# Patient Record
Sex: Male | Born: 1960
Health system: Southern US, Community
[De-identification: ages and names within clinical notes are randomized; demographics above are authoritative.]

## PROBLEM LIST (undated history)

## (undated) DIAGNOSIS — I10 Essential (primary) hypertension: Secondary | ICD-10-CM

## (undated) DIAGNOSIS — E663 Overweight: Secondary | ICD-10-CM

## (undated) DIAGNOSIS — E786 Lipoprotein deficiency: Secondary | ICD-10-CM

## (undated) DIAGNOSIS — G4733 Obstructive sleep apnea (adult) (pediatric): Secondary | ICD-10-CM

## (undated) DIAGNOSIS — R7401 Elevation of levels of liver transaminase levels: Secondary | ICD-10-CM

## (undated) DIAGNOSIS — R42 Dizziness and giddiness: Secondary | ICD-10-CM

## (undated) DIAGNOSIS — N200 Calculus of kidney: Secondary | ICD-10-CM

## (undated) DIAGNOSIS — E559 Vitamin D deficiency, unspecified: Secondary | ICD-10-CM

## (undated) DIAGNOSIS — E781 Pure hyperglyceridemia: Secondary | ICD-10-CM

## (undated) DIAGNOSIS — F32A Depression, unspecified: Secondary | ICD-10-CM

## (undated) DIAGNOSIS — R0683 Snoring: Secondary | ICD-10-CM

## (undated) DIAGNOSIS — F419 Anxiety disorder, unspecified: Secondary | ICD-10-CM

## (undated) DIAGNOSIS — F4002 Agoraphobia without panic disorder: Secondary | ICD-10-CM

## (undated) DIAGNOSIS — R740 Nonspecific elevation of levels of transaminase and lactic acid dehydrogenase [LDH]: Secondary | ICD-10-CM

## (undated) DIAGNOSIS — F329 Major depressive disorder, single episode, unspecified: Secondary | ICD-10-CM

## (undated) DIAGNOSIS — R7301 Impaired fasting glucose: Secondary | ICD-10-CM

## (undated) DIAGNOSIS — K760 Fatty (change of) liver, not elsewhere classified: Secondary | ICD-10-CM

## (undated) HISTORY — DX: Pure hyperglyceridemia: E78.1

## (undated) HISTORY — DX: Calculus of kidney: N20.0

## (undated) HISTORY — DX: Dizziness and giddiness: R42

## (undated) HISTORY — DX: Nonspecific elevation of levels of transaminase and lactic acid dehydrogenase (ldh): R74.0

## (undated) HISTORY — DX: Vitamin D deficiency, unspecified: E55.9

## (undated) HISTORY — DX: Essential (primary) hypertension: I10

## (undated) HISTORY — DX: Snoring: R06.83

## (undated) HISTORY — DX: Elevation of levels of liver transaminase levels: R74.01

## (undated) HISTORY — DX: Lipoprotein deficiency: E78.6

## (undated) HISTORY — DX: Fatty (change of) liver, not elsewhere classified: K76.0

## (undated) HISTORY — DX: Depression, unspecified: F32.A

## (undated) HISTORY — DX: Major depressive disorder, single episode, unspecified: F32.9

## (undated) HISTORY — DX: Anxiety disorder, unspecified: F41.9

## (undated) HISTORY — DX: Impaired fasting glucose: R73.01

## (undated) HISTORY — DX: Agoraphobia without panic disorder: F40.02

## (undated) HISTORY — DX: Obstructive sleep apnea (adult) (pediatric): G47.33

## (undated) HISTORY — DX: Overweight: E66.3

---

## 2004-03-05 ENCOUNTER — Emergency Department: Payer: Self-pay | Admitting: Internal Medicine

## 2004-03-05 ENCOUNTER — Other Ambulatory Visit: Payer: Self-pay

## 2005-02-21 ENCOUNTER — Ambulatory Visit: Payer: Self-pay | Admitting: Internal Medicine

## 2005-02-28 ENCOUNTER — Ambulatory Visit: Payer: Self-pay | Admitting: Urology

## 2005-03-16 ENCOUNTER — Other Ambulatory Visit: Payer: Self-pay

## 2005-03-17 ENCOUNTER — Ambulatory Visit: Payer: Self-pay | Admitting: Urology

## 2006-03-28 HISTORY — PX: NASAL SINUS SURGERY: SHX719

## 2006-09-30 ENCOUNTER — Ambulatory Visit: Payer: Self-pay | Admitting: Emergency Medicine

## 2006-10-07 ENCOUNTER — Ambulatory Visit: Payer: Self-pay | Admitting: Emergency Medicine

## 2006-10-07 ENCOUNTER — Other Ambulatory Visit: Payer: Self-pay

## 2006-10-09 ENCOUNTER — Ambulatory Visit: Payer: Self-pay | Admitting: Internal Medicine

## 2006-10-23 ENCOUNTER — Ambulatory Visit: Payer: Self-pay | Admitting: Otolaryngology

## 2006-11-15 ENCOUNTER — Ambulatory Visit: Payer: Self-pay | Admitting: Otolaryngology

## 2009-11-29 ENCOUNTER — Emergency Department: Payer: Self-pay | Admitting: Internal Medicine

## 2013-10-03 ENCOUNTER — Ambulatory Visit: Payer: Self-pay | Admitting: Family Medicine

## 2014-07-15 DIAGNOSIS — F4002 Agoraphobia without panic disorder: Secondary | ICD-10-CM | POA: Insufficient documentation

## 2014-07-15 DIAGNOSIS — F32A Depression, unspecified: Secondary | ICD-10-CM | POA: Insufficient documentation

## 2014-07-15 DIAGNOSIS — R740 Nonspecific elevation of levels of transaminase and lactic acid dehydrogenase [LDH]: Secondary | ICD-10-CM

## 2014-07-15 DIAGNOSIS — F419 Anxiety disorder, unspecified: Secondary | ICD-10-CM

## 2014-07-15 DIAGNOSIS — R7301 Impaired fasting glucose: Secondary | ICD-10-CM | POA: Insufficient documentation

## 2014-07-15 DIAGNOSIS — E559 Vitamin D deficiency, unspecified: Secondary | ICD-10-CM | POA: Insufficient documentation

## 2014-07-15 DIAGNOSIS — F329 Major depressive disorder, single episode, unspecified: Secondary | ICD-10-CM

## 2014-07-15 DIAGNOSIS — R7401 Elevation of levels of liver transaminase levels: Secondary | ICD-10-CM | POA: Insufficient documentation

## 2014-07-15 DIAGNOSIS — I10 Essential (primary) hypertension: Secondary | ICD-10-CM

## 2014-10-21 ENCOUNTER — Other Ambulatory Visit: Payer: Self-pay | Admitting: Family Medicine

## 2014-10-21 DIAGNOSIS — F419 Anxiety disorder, unspecified: Secondary | ICD-10-CM | POA: Insufficient documentation

## 2014-10-21 MED ORDER — LORAZEPAM 1 MG PO TABS: 1.0000 mg | ORAL_TABLET | Freq: Two times a day (BID) | ORAL | Status: DC | PRN

## 2014-10-21 NOTE — Progress Notes (Signed)
Patient will be due for refill of benzo Last note in [facility] reviewed No concern for misuse NCCS[place] reviewed; last fill of 45 pills of lorazepam was on July 21, 2014 Rx written, to be faxed to [facility] in August

## 2014-10-28 DIAGNOSIS — F419 Anxiety disorder, unspecified: Secondary | ICD-10-CM | POA: Insufficient documentation

## 2014-10-28 DIAGNOSIS — E786 Lipoprotein deficiency: Secondary | ICD-10-CM | POA: Insufficient documentation

## 2014-11-06 ENCOUNTER — Ambulatory Visit (INDEPENDENT_AMBULATORY_CARE_PROVIDER_SITE_OTHER): Payer: PRIVATE HEALTH INSURANCE | Admitting: Family Medicine

## 2014-11-06 ENCOUNTER — Other Ambulatory Visit: Payer: Self-pay | Admitting: Family Medicine

## 2014-11-06 ENCOUNTER — Encounter: Payer: Self-pay | Admitting: Family Medicine

## 2014-11-06 VITALS — BP 107/73 | HR 78 | Temp 98.0°F | Wt 239.0 lb

## 2014-11-06 DIAGNOSIS — F419 Anxiety disorder, unspecified: Secondary | ICD-10-CM | POA: Diagnosis not present

## 2014-11-06 DIAGNOSIS — J309 Allergic rhinitis, unspecified: Secondary | ICD-10-CM | POA: Insufficient documentation

## 2014-11-06 DIAGNOSIS — Z79899 Other long term (current) drug therapy: Secondary | ICD-10-CM | POA: Insufficient documentation

## 2014-11-06 DIAGNOSIS — Z5181 Encounter for therapeutic drug level monitoring: Secondary | ICD-10-CM | POA: Diagnosis not present

## 2014-11-06 DIAGNOSIS — R7401 Elevation of levels of liver transaminase levels: Secondary | ICD-10-CM

## 2014-11-06 DIAGNOSIS — Z0289 Encounter for other administrative examinations: Secondary | ICD-10-CM | POA: Diagnosis not present

## 2014-11-06 DIAGNOSIS — E663 Overweight: Secondary | ICD-10-CM

## 2014-11-06 DIAGNOSIS — R7301 Impaired fasting glucose: Secondary | ICD-10-CM | POA: Diagnosis not present

## 2014-11-06 DIAGNOSIS — R740 Nonspecific elevation of levels of transaminase and lactic acid dehydrogenase [LDH]: Secondary | ICD-10-CM

## 2014-11-06 DIAGNOSIS — E786 Lipoprotein deficiency: Secondary | ICD-10-CM

## 2014-11-06 HISTORY — DX: Overweight: E66.3

## 2014-11-06 MED ORDER — LORAZEPAM 1 MG PO TABS: 1.0000 mg | ORAL_TABLET | Freq: Two times a day (BID) | ORAL | Status: DC | PRN

## 2014-11-06 MED ORDER — ARIPIPRAZOLE 10 MG PO TABS: 10.0000 mg | ORAL_TABLET | Freq: Every day | ORAL | Status: DC

## 2014-11-06 MED ORDER — SERTRALINE HCL 100 MG PO TABS: 100.0000 mg | ORAL_TABLET | Freq: Every day | ORAL | Status: DC

## 2014-11-06 NOTE — Addendum Note (Signed)
Addended byZella Ball, Clayborne Divis J on: 11/06/2014 11:02 AM   Modules accepted: Orders

## 2014-11-06 NOTE — Assessment & Plan Note (Signed)
Check UDS. 

## 2014-11-06 NOTE — Patient Instructions (Addendum)
With your low HDL cholesterol, try to increase your physical activity (walking, basketball, swimming, etc) to 150 minutes per week Try to lose about 10 pounds before your next visit here in 3 months; that will also help raise your HDL Come fasting to your next appointment and we'll check your cholesterol then If the cough does not go away in 3-4 weeks, or gets worse, then do let me know You might consider switching for a month from allegra to claritin to see if that helps better

## 2014-11-06 NOTE — Assessment & Plan Note (Signed)
Doing well on current medicine regimen; will continue same; NCCS[place] site reviewed; no inappropriate fills of PRN benzo use

## 2014-11-06 NOTE — Assessment & Plan Note (Signed)
Last A1C was done on May 17th; recheck on or after Nov 17th at next visit

## 2014-11-06 NOTE — Assessment & Plan Note (Signed)
HDL was 37 on Aug 12, 2014; will check at next visit; encouraged exercise and modest weight loss

## 2014-11-06 NOTE — Assessment & Plan Note (Signed)
New controlled substance contract signed, discussed; UDS collected today per protocol

## 2014-11-06 NOTE — Addendum Note (Signed)
Addended byZella Ball, AMY J on: 11/06/2014 11:00 AM   Modules accepted: Orders

## 2014-11-06 NOTE — Assessment & Plan Note (Signed)
With some night-time cough; may be that the allegra is no longer efficacious; will have him consider switching to claritin instead to see if that helps; if cough does not resolve in a few weeks, I asked him to contact me

## 2014-11-06 NOTE — Assessment & Plan Note (Signed)
Last labs in May showed normal level; monitor as he is on atypical and is overweight

## 2014-11-06 NOTE — Assessment & Plan Note (Signed)
Check UDS per protocol 

## 2014-11-06 NOTE — Progress Notes (Signed)
BP 107/73 mmHg  Pulse 78  Temp(Src) 98 F (36.7 C)  Wt 239 lb (108.41 kg)  SpO2 98%   Subjective:    Patient ID: Richard [name]., male    DOB: [birth year], 54 y.o.   MRN: [id number]  HPI: Richard [name] is a 54 y.o. male  Chief Complaint  Patient presents with  . Anxiety    needs med refills   He is here for a visit for his chronic anxiety; he is on Abilify and sertraline and lorazepam; anxiety is controlled with those medicines  He has low HDL; not exercising regularly; he does play golf occasionally; takes stairs at work; would be willing to be more active  Obesity; he has tried to lose weight in the past; worked out with [employer], free weights; they have machines at work or he could go to [employer], [employer]; no sugary drinks  Allergic rhinitis; some cough at night; has been Careers adviser for about two years  Relevant past medical, surgical, family and social history reviewed and updated as indicated. Interim medical history since our last visit reviewed. Allergies and medications reviewed and updated.  Review of Systems  Constitutional: Negative for fever and chills.  HENT: Negative for ear pain.   Eyes: Negative for pain.  Respiratory: Positive for cough (cough at night some times; one week duration; air conditioner went out and got a new unit).   Gastrointestinal: Negative for diarrhea and constipation.  Endocrine: Negative for polydipsia.  Genitourinary: Negative for dysuria.  Musculoskeletal: Negative for joint swelling.  Skin: Negative for rash.  Allergic/Immunologic: Negative for food allergies.  Neurological: Negative for headaches.  Psychiatric/Behavioral: Negative for suicidal ideas, confusion, decreased concentration and agitation. The patient is nervous/anxious (chronic anxiety but controlled with medicine). The patient is not hyperactive.     Per HPI unless specifically indicated above  Outpatient Encounter Prescriptions as of 11/06/2014  Medication Sig   . ARIPiprazole (ABILIFY) 10 MG tablet Take 1 tablet (10 mg total) by mouth daily.  [name] azelastine (ASTELIN) 0.1 % nasal spray Place into both nostrils 2 (two) times daily. Use in each nostril as directed  . cholecalciferol (VITAMIN D) 1000 UNITS tablet Take 1,000 Units by mouth daily.  . fexofenadine (ALLEGRA) 180 MG tablet Take 180 mg by mouth daily.  [name] LORazepam (ATIVAN) 1 MG tablet Take 1 tablet (1 mg total) by mouth every 12 (twelve) hours as needed for anxiety. Do NOT drive; do NOT combine with pain medicine, sleeping pills, or alcohol  . Multiple Vitamin (MULTIVITAMIN) tablet Take 1 tablet by mouth daily.  . sertraline (ZOLOFT) 100 MG tablet Take 1 tablet (100 mg total) by mouth daily.  . vitamin B-12 (CYANOCOBALAMIN) 1000 MCG tablet Take 6,000 mcg by mouth daily.  . [DISCONTINUED] ARIPiprazole (ABILIFY) 10 MG tablet Take 10 mg by mouth daily.  . [DISCONTINUED] LORazepam (ATIVAN) 1 MG tablet Take 1 tablet (1 mg total) by mouth every 12 (twelve) hours as needed for anxiety. Do NOT drive; do NOT combine with pain medicine, sleeping pills, or alcohol  . [DISCONTINUED] sertraline (ZOLOFT) 100 MG tablet Take 100 mg by mouth daily.   No facility-administered encounter medications on file as of 11/06/2014.       Objective:    BP 107/73 mmHg  Pulse 78  Temp(Src) 98 F (36.7 C)  Wt 239 lb (108.41 kg)  SpO2 98%  Wt Readings from Last 3 Encounters:  11/06/14 239 lb (108.41 kg)  08/12/14 234 lb (106.142 kg)  05/22/14 236 lb (107.049 kg)    Physical Exam  Constitutional: He appears well-developed and well-nourished. No distress.  HENT:  Head: Normocephalic and atraumatic.  Eyes: EOM are normal. No scleral icterus.  Neck: No thyromegaly present.  Cardiovascular: Normal rate and regular rhythm.   Pulmonary/Chest: Effort normal and breath sounds normal.  Abdominal: Soft. He exhibits no distension.  Musculoskeletal: He exhibits no edema.  Neurological: Coordination normal.  Skin: Skin is  warm and dry. No pallor.  Psychiatric: He has a normal mood and affect. His speech is normal and behavior is normal. Judgment and thought content normal. His affect is not blunt, not labile and not inappropriate. He is not slowed and not withdrawn. Cognition and memory are normal.  Quiet, soft-spoken, good eye contact with examiner He is attentive.    No results found for this or any previous visit.    Assessment & Plan:   Problem List Items Addressed This Visit      Respiratory   Allergic rhinitis    With some night-time cough; may be that the allegra is no longer efficacious; will have him consider switching to claritin instead to see if that helps; if cough does not resolve in a few weeks, I asked him to contact me        Endocrine   IFG (impaired fasting glucose)    Last A1C was done on May 17th; recheck on or after Nov 17th at next visit        Other   Elevated serum glutamic pyruvic transaminase (SGPT) level    Last labs in May showed normal level; monitor as he is on atypical and is overweight      Chronic anxiety - Primary    Doing well on current medicine regimen; will continue same; NCCS[place] site reviewed; no inappropriate fills of PRN benzo use      Relevant Medications   LORazepam (ATIVAN) 1 MG tablet   sertraline (ZOLOFT) 100 MG tablet   Other Relevant Orders   POCT Urine Drug Screen   Low HDL (under 40)    HDL was 37 on Aug 12, 2014; will check at next visit; encouraged exercise and modest weight loss      Medication monitoring encounter    Check UDS per protocol      Relevant Orders   POCT Urine Drug Screen   Benzodiazepine use agreement exists    New controlled substance contract signed, discussed; UDS collected today per protocol      Relevant Orders   POCT Urine Drug Screen   Overweight (BMI 25.0-29.9)    Encouraged patient to work on modest weight loss         Follow up plan: Return in about 3 months (around 02/06/2015) for cholesterol,  anxiety.  Orders Placed This Encounter  Procedures  . POCT Urine Drug Screen    Rx for Lorazepam handed to patient by Almira Coaster at front desk  An after-visit summary was printed and given to the patient at check-out.  Please see the patient instructions which may contain other information and recommendations beyond what is mentioned above in the assessment and plan.

## 2014-11-06 NOTE — Assessment & Plan Note (Signed)
Encouraged patient to work on modest weight loss 

## 2015-02-05 ENCOUNTER — Ambulatory Visit: Payer: PRIVATE HEALTH INSURANCE | Admitting: Family Medicine

## 2015-02-06 ENCOUNTER — Ambulatory Visit: Payer: PRIVATE HEALTH INSURANCE | Admitting: Family Medicine

## 2015-02-13 ENCOUNTER — Encounter: Payer: Self-pay | Admitting: Family Medicine

## 2015-02-13 ENCOUNTER — Ambulatory Visit (INDEPENDENT_AMBULATORY_CARE_PROVIDER_SITE_OTHER): Payer: PRIVATE HEALTH INSURANCE | Admitting: Family Medicine

## 2015-02-13 VITALS — BP 117/80 | HR 75 | Temp 97.5°F | Ht 74.0 in | Wt 235.0 lb

## 2015-02-13 DIAGNOSIS — E786 Lipoprotein deficiency: Secondary | ICD-10-CM | POA: Diagnosis not present

## 2015-02-13 DIAGNOSIS — R0602 Shortness of breath: Secondary | ICD-10-CM | POA: Diagnosis not present

## 2015-02-13 DIAGNOSIS — Z0289 Encounter for other administrative examinations: Secondary | ICD-10-CM | POA: Diagnosis not present

## 2015-02-13 DIAGNOSIS — R740 Nonspecific elevation of levels of transaminase and lactic acid dehydrogenase [LDH]: Secondary | ICD-10-CM | POA: Diagnosis not present

## 2015-02-13 DIAGNOSIS — F419 Anxiety disorder, unspecified: Secondary | ICD-10-CM | POA: Diagnosis not present

## 2015-02-13 DIAGNOSIS — R0683 Snoring: Secondary | ICD-10-CM | POA: Diagnosis not present

## 2015-02-13 DIAGNOSIS — R05 Cough: Secondary | ICD-10-CM

## 2015-02-13 DIAGNOSIS — E669 Obesity, unspecified: Secondary | ICD-10-CM

## 2015-02-13 DIAGNOSIS — R059 Cough, unspecified: Secondary | ICD-10-CM | POA: Insufficient documentation

## 2015-02-13 DIAGNOSIS — Z79899 Other long term (current) drug therapy: Secondary | ICD-10-CM

## 2015-02-13 DIAGNOSIS — R7301 Impaired fasting glucose: Secondary | ICD-10-CM

## 2015-02-13 DIAGNOSIS — E559 Vitamin D deficiency, unspecified: Secondary | ICD-10-CM | POA: Diagnosis not present

## 2015-02-13 DIAGNOSIS — F4002 Agoraphobia without panic disorder: Secondary | ICD-10-CM | POA: Diagnosis not present

## 2015-02-13 DIAGNOSIS — R7401 Elevation of levels of liver transaminase levels: Secondary | ICD-10-CM

## 2015-02-13 MED ORDER — LORAZEPAM 1 MG PO TABS: 1.0000 mg | ORAL_TABLET | Freq: Two times a day (BID) | ORAL | Status: DC | PRN

## 2015-02-13 NOTE — Assessment & Plan Note (Signed)
Refer to pulm for consideration of sleep study

## 2015-02-13 NOTE — Assessment & Plan Note (Signed)
With symptoms of snoring; will get CXR and refer to pulm for consideration of sleep study, other evaluation

## 2015-02-13 NOTE — Progress Notes (Signed)
BP 117/80 mmHg  Pulse 75  Temp(Src) 97.5 F (36.4 C)  Ht  (1.88 m)  Wt 235 lb (106.595 kg)  BMI 30.16 kg/m2  SpO2 97%   Subjective:    Patient ID: Richard Leatherwood., male    DOB: [birth year], 54 y.o.   MRN: [id number]  HPI: Richard [name] is a 54 y.o. male  Chief Complaint  Patient presents with  . Hyperlipidemia  . Anxiety   GAD 7 : Generalized Anxiety Score 02/13/2015  Nervous, Anxious, on Edge 1  Control/stop worrying 0  Worry too much - different things 1  Trouble relaxing 0  Restless 0  Easily annoyed or irritable 0  Afraid - awful might happen 0  Total GAD 7 Score 2  Anxiety Difficulty Somewhat difficult  His medicines are working well; he is on sertraline, abilify, and lorazepam; he wants to stay at the current doses  He has had low vitamin D, but it came back to normal last time; he is taking 1000 iu daily  He had prediabetes, but last A1C was in normal range; he is working on weight loss  He has low HDL and mildly elevated TG; he does not eat a lot of eggs and bacon and sausage; little bit of cheese  He is going to do a health screen through work  Relevant past medical, surgical, family and social history reviewed and updated as indicated. No diabetes in the family; no high cholesterol Interim medical history since our last visit reviewed. none Allergies and medications reviewed and updated.  Review of Systems  Respiratory: Positive for cough (little bit of night) and shortness of breath (just very momentary when squatting down to line up a putt). Negative for wheezing.   Cardiovascular: Negative for chest pain, palpitations and leg swelling.  Gastrointestinal:       Not any acid reflux  Allergic/Immunologic: Positive for environmental allergies (grass and poison ivy). Negative for food allergies.   Per HPI unless specifically indicated above     Objective:    BP 117/80 mmHg  Pulse 75  Temp(Src) 97.5 F (36.4 C)  Ht  (1.88 m)  Wt 235 lb  (106.595 kg)  BMI 30.16 kg/m2  SpO2 97%  Wt Readings from Last 3 Encounters:  02/13/15 235 lb (106.595 kg)  11/06/14 239 lb (108.41 kg)  08/12/14 234 lb (106.142 kg)    Physical Exam  Constitutional: He appears well-developed and well-nourished. No distress.  HENT:  Head: Normocephalic and atraumatic.  Eyes: EOM are normal. No scleral icterus.  Neck: No JVD present. No thyromegaly present.  Cardiovascular: Normal rate and regular rhythm.   Pulmonary/Chest: Effort normal and breath sounds normal.  Abdominal: Soft. He exhibits no distension.  Musculoskeletal: He exhibits no edema.  Neurological: Coordination normal.  Skin: Skin is warm and dry. No pallor.  Psychiatric: He has a normal mood and affect. His speech is normal and behavior is normal. Judgment and thought content normal. His affect is not blunt, not labile and not inappropriate. He is not slowed and not withdrawn. Cognition and memory are normal.  Quiet, soft-spoken, good eye contact with examiner He is attentive.  Reviewed last set of labs in [facility], May 2016     Assessment & Plan:   Problem List Items Addressed This Visit      Endocrine   IFG (impaired fasting glucose) - Primary    Last one in May reviewed, normal at 5.6; check again next May or  June; skip that today and just check fasting glucose; healthy eating, heathy weight, activity        Other   Agoraphobia without history of panic disorder    Current medicines working well, continue; controlled substance on the chart      Elevated serum glutamic pyruvic transaminase (SGPT) level    Last SGPT was normal; he is coitnuing to lose weight, congratulated him on this; check SGPT again      Relevant Orders   Comprehensive metabolic panel   Vitamin D deficiency    Back to normal last check; will not draw today but continue supplement 1000 iu daily      Chronic anxiety   Relevant Medications   LORazepam (ATIVAN) 1 MG tablet   Anxiety     Continue same meds      Relevant Medications   LORazepam (ATIVAN) 1 MG tablet   Low HDL (under 40)    Activity encouraged; check lipids today      Relevant Orders   Lipid Panel w/o Chol/HDL Ratio   Benzodiazepine use agreement exists    Taking benzos with pain medicine can be dangerous I explained, so talk to provider about all meds he is taking if ever having surgery      Obesity    Activity encouraged; build up slowly and gradually; continue weight loss, better eating      Cough    Chest xray ordered, refer to pulmonologist      Relevant Orders   Ambulatory referral to Pulmonology   DG Chest 2 View   Shortness of breath    With symptoms of snoring; will get CXR and refer to pulm for consideration of sleep study, other evaluation      Relevant Orders   Ambulatory referral to Pulmonology   DG Chest 2 View   Snores    Refer to pulm for consideration of sleep study      Relevant Orders   Ambulatory referral to Pulmonology       Follow up plan: Return in about 3 months (around 05/16/2015).  Orders Placed This Encounter  Procedures  . DG Chest 2 View  . Comprehensive metabolic panel  . Lipid Panel w/o Chol/HDL Ratio  . Ambulatory referral to Pulmonology   Meds ordered this encounter  Medications  . LORazepam (ATIVAN) 1 MG tablet    Sig: Take 1 tablet (1 mg total) by mouth every 12 (twelve) hours as needed for anxiety. Do NOT drive; do NOT combine with pain medicine, sleeping pills, or alcohol    Dispense:  40 tablet    Refill:  2    Fill on or after November 20, 2014   An after-visit summary was printed and given to the patient at check-out.  Please see the patient instructions which may contain other information and recommendations beyond what is mentioned above in the assessment and plan.

## 2015-02-13 NOTE — Assessment & Plan Note (Signed)
Current medicines working well, continue; controlled substance on the chart

## 2015-02-13 NOTE — Assessment & Plan Note (Signed)
Back to normal last check; will not draw today but continue supplement 1000 iu daily

## 2015-02-13 NOTE — Assessment & Plan Note (Signed)
Last one in May reviewed, normal at 5.6; check again next May or June; skip that today and just check fasting glucose; healthy eating, heathy weight, activity

## 2015-02-13 NOTE — Assessment & Plan Note (Signed)
Continue same meds

## 2015-02-13 NOTE — Patient Instructions (Signed)
Keep up the good work with weight loss and healthy eating If you start to increase your activity, always build up slowly and gradually We'll refer you to a pulmonologist for your cough, shortness of breath, and snoring He will likely check to see if you have sleep apnea We'll check your labs today and contact you with those results If you have not heard anything from my staff in a week about any orders/referrals/studies from today, please contact us here to follow-up [phone] I do recommend yearly flu shots; for individuals who don't want flu shots, try to practice excellent hand hygiene, and avoid nursing homes, day cares, and hospitals during peak flu season; taking extra vitamin C daily during flu/cold season may help boost your immune system too Return in 3 months for next follow-up

## 2015-02-13 NOTE — Assessment & Plan Note (Signed)
Activity encouraged; check lipids today

## 2015-02-13 NOTE — Assessment & Plan Note (Signed)
Chest xray ordered, refer to pulmonologist

## 2015-02-13 NOTE — Assessment & Plan Note (Signed)
Last SGPT was normal; he is coitnuing to lose weight, congratulated him on this; check SGPT again

## 2015-02-13 NOTE — Assessment & Plan Note (Signed)
Activity encouraged; build up slowly and gradually; continue weight loss, better eating

## 2015-02-13 NOTE — Assessment & Plan Note (Signed)
Taking benzos with pain medicine can be dangerous I explained, so talk to provider about all meds he is taking if ever having surgery

## 2015-02-14 ENCOUNTER — Encounter: Payer: Self-pay | Admitting: Family Medicine

## 2015-02-14 LAB — COMPREHENSIVE METABOLIC PANEL
ALBUMIN: 4.4 g/dL (ref 3.5–5.5)
ALK PHOS: 66 IU/L (ref 39–117)
ALT: 48 IU/L — ABNORMAL HIGH (ref 0–44)
AST: 37 IU/L (ref 0–40)
Albumin/Globulin Ratio: 1.4 (ref 1.1–2.5)
BUN / CREAT RATIO: 14 (ref 9–20)
BUN: 15 mg/dL (ref 6–24)
Bilirubin Total: 0.3 mg/dL (ref 0.0–1.2)
CO2: 26 mmol/L (ref 18–29)
CREATININE: 1.1 mg/dL (ref 0.76–1.27)
Calcium: 9.7 mg/dL (ref 8.7–10.2)
Chloride: 102 mmol/L (ref 97–106)
GFR calc non Af Amer: 77 mL/min/{1.73_m2} (ref >59)
GFR, EST AFRICAN AMERICAN: 89 mL/min/{1.73_m2} (ref >59)
GLOBULIN, TOTAL: 3.1 g/dL (ref 1.5–4.5)
Glucose: 99 mg/dL (ref 65–99)
Potassium: 4.4 mmol/L (ref 3.5–5.2)
SODIUM: 144 mmol/L (ref 136–144)
Total Protein: 7.5 g/dL (ref 6.0–8.5)

## 2015-02-14 LAB — LIPID PANEL W/O CHOL/HDL RATIO
CHOLESTEROL TOTAL: 174 mg/dL (ref 100–199)
HDL: 39 mg/dL — ABNORMAL LOW (ref >39)
LDL CALC: 105 mg/dL — AB (ref 0–99)
TRIGLYCERIDES: 150 mg/dL — AB (ref 0–149)
VLDL Cholesterol Cal: 30 mg/dL (ref 5–40)

## 2015-03-06 ENCOUNTER — Telehealth: Payer: Self-pay | Admitting: Family Medicine

## 2015-03-06 NOTE — Telephone Encounter (Signed)
Patient notified

## 2015-03-06 NOTE — Telephone Encounter (Signed)
Please remind Richard [name] to get his chest xray done soon Thank you

## 2015-04-07 ENCOUNTER — Encounter: Payer: Self-pay | Admitting: Physician Assistant

## 2015-04-07 ENCOUNTER — Ambulatory Visit: Payer: Self-pay | Admitting: Physician Assistant

## 2015-04-07 VITALS — BP 119/70 | HR 90 | Temp 98.1°F

## 2015-04-07 DIAGNOSIS — J329 Chronic sinusitis, unspecified: Secondary | ICD-10-CM

## 2015-04-07 DIAGNOSIS — R05 Cough: Secondary | ICD-10-CM

## 2015-04-07 DIAGNOSIS — R059 Cough, unspecified: Secondary | ICD-10-CM

## 2015-04-07 MED ORDER — HYDROCOD POLST-CPM POLST ER 10-8 MG/5ML PO SUER: 5.0000 mL | Freq: Two times a day (BID) | ORAL | Status: DC | PRN

## 2015-04-07 MED ORDER — AMOXICILLIN 875 MG PO TABS: 875.0000 mg | ORAL_TABLET | Freq: Two times a day (BID) | ORAL | Status: DC

## 2015-04-07 NOTE — Progress Notes (Signed)
S / one week hx of nasal congestion  ,blowing dark mucous , facial pain, cough worse at night without SOB   O/ VSS Nad ,ENT nasal mucosa red and swollen , + left max and frontal tenderness, ? Deviated septum , throat clear neck supple heart rsr lungs clear  A sinusitis , cough P amoxicillan 875 bid , rx tussionex 1 tsp q 12 hours prn 100 cc no refills. Nasal saline products bid and prn  F/u prn

## 2015-05-22 ENCOUNTER — Ambulatory Visit: Payer: PRIVATE HEALTH INSURANCE | Admitting: Family Medicine

## 2015-05-29 ENCOUNTER — Encounter: Payer: Self-pay | Admitting: Family Medicine

## 2015-05-29 ENCOUNTER — Ambulatory Visit (INDEPENDENT_AMBULATORY_CARE_PROVIDER_SITE_OTHER): Payer: Managed Care, Other (non HMO) | Admitting: Family Medicine

## 2015-05-29 VITALS — BP 115/77 | HR 66 | Temp 97.4°F | Ht 74.0 in | Wt 236.0 lb

## 2015-05-29 DIAGNOSIS — F4002 Agoraphobia without panic disorder: Secondary | ICD-10-CM

## 2015-05-29 DIAGNOSIS — Z1211 Encounter for screening for malignant neoplasm of colon: Secondary | ICD-10-CM | POA: Insufficient documentation

## 2015-05-29 DIAGNOSIS — R7301 Impaired fasting glucose: Secondary | ICD-10-CM | POA: Diagnosis not present

## 2015-05-29 DIAGNOSIS — Z5181 Encounter for therapeutic drug level monitoring: Secondary | ICD-10-CM

## 2015-05-29 DIAGNOSIS — R740 Nonspecific elevation of levels of transaminase and lactic acid dehydrogenase [LDH]: Secondary | ICD-10-CM

## 2015-05-29 DIAGNOSIS — E786 Lipoprotein deficiency: Secondary | ICD-10-CM | POA: Diagnosis not present

## 2015-05-29 DIAGNOSIS — E669 Obesity, unspecified: Secondary | ICD-10-CM

## 2015-05-29 DIAGNOSIS — Z114 Encounter for screening for human immunodeficiency virus [HIV]: Secondary | ICD-10-CM | POA: Diagnosis not present

## 2015-05-29 DIAGNOSIS — I10 Essential (primary) hypertension: Secondary | ICD-10-CM

## 2015-05-29 DIAGNOSIS — E559 Vitamin D deficiency, unspecified: Secondary | ICD-10-CM

## 2015-05-29 DIAGNOSIS — Z1159 Encounter for screening for other viral diseases: Secondary | ICD-10-CM | POA: Diagnosis not present

## 2015-05-29 DIAGNOSIS — F419 Anxiety disorder, unspecified: Secondary | ICD-10-CM | POA: Diagnosis not present

## 2015-05-29 DIAGNOSIS — Z0289 Encounter for other administrative examinations: Secondary | ICD-10-CM | POA: Diagnosis not present

## 2015-05-29 DIAGNOSIS — Z79899 Other long term (current) drug therapy: Secondary | ICD-10-CM

## 2015-05-29 DIAGNOSIS — R7401 Elevation of levels of liver transaminase levels: Secondary | ICD-10-CM

## 2015-05-29 MED ORDER — LORAZEPAM 1 MG PO TABS: 1.0000 mg | ORAL_TABLET | Freq: Two times a day (BID) | ORAL | Status: DC | PRN

## 2015-05-29 NOTE — Assessment & Plan Note (Signed)
Check A1c today; work on [employer]weight management, healthy eating

## 2015-05-29 NOTE — Assessment & Plan Note (Signed)
Check today; work on weight loss and healthy eating

## 2015-05-29 NOTE — Assessment & Plan Note (Addendum)
Check fasting lipids today; encouraged pt to keep exercising, work on weight loss

## 2015-05-29 NOTE — Assessment & Plan Note (Signed)
Works at [employer]; 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; continue sertraline at current dose

## 2015-05-29 NOTE — Assessment & Plan Note (Signed)
Check labs today.

## 2015-05-29 NOTE — Assessment & Plan Note (Signed)
Excellent control today 

## 2015-05-29 NOTE — Patient Instructions (Addendum)
Try to limit saturated fats in your diet (bologna, hot dogs, barbeque, cheeseburgers, hamburgers, steak, bacon, sausage, cheese, etc.) and get more fresh fruits, vegetables, and whole grains Check out the information at familydoctor.org entitled "What It Takes to Lose Weight" Try to lose between 1-2 pounds per week by taking in fewer calories and burning off more calories You can succeed by limiting portions, limiting foods dense in calories and fat, becoming more active, and drinking 8 glasses of water a day (64 ounces) Don't skip meals, especially breakfast, as skipping meals may alter your metabolism Do not use over-the-counter weight loss pills or gimmicks that claim rapid weight loss A healthy BMI (or body mass index) is between 18.5 and 24.9 Your BMI today was 30.29 which is technically in the obese category Let's aim to get that down under 30 in the next month (that will be just 3 pounds!!) and then your weight will no longer be in the obese category, but just overweight Try to get your BMI down around 27 in the next 4-6 months (that will be a goal weight of 210 pounds) You can calculate your ideal BMI at the NIH website [employer]huhttp://www.nhlbi.nih.gov/health/educational/lose_wt/BMI/bmicalc.htm Never ever combine lorazepam with any alcohol, narcotic cough syrup, pain medicine, or sleeping pills

## 2015-05-29 NOTE — Assessment & Plan Note (Signed)
Check level today and supplement if needed 

## 2015-05-29 NOTE — Assessment & Plan Note (Addendum)
Controlled substance agreement on file; reviewed [place]CCS[place] site; there was a prescription filled for tussionex in January; I talked with patient about this; he was not aware that was a narcotic; I reviewed acute care note; I do not think the patient was doctor shopping and believe that he just wasn't aware this was in violation of the controlled substance agreement; we talked about the danger of unintentional overdose and death from mixing controlled substances and supressing respiratory drive; cautions given; I will give him this one strike, but I explained the next one is final, I don't give three strikes; he agrees; we reviewed that part of the contract again, reasons to call, always notify other prescribers of the medicine I give him and that he is on a controlled substance contract; refills provided after thorough teaching and agreement

## 2015-05-29 NOTE — Assessment & Plan Note (Signed)
Discussed one-time HIV screening recommendation per USPSTF guidelines; patient agrees with testing; HIV antibody ordered 

## 2015-05-29 NOTE — Assessment & Plan Note (Signed)
Discussed one-time hep C screening recommendation for individuals born between 1945-1965 per [place]PSTF guidelines; patient agrees with testing; Hep C Ab ordered 

## 2015-05-29 NOTE — Progress Notes (Signed)
BP 115/77 mmHg  Pulse 66  Temp(Src) 97.4 F (36.3 C)  Ht 6\' 2"  (1.88 m)  Wt 236 lb (107.049 kg)  BMI 30.29 kg/m2  SpO2 98%   Subjective:    Patient ID: Richard LeatherwoodJames L Pigman Jr., male    DOB: Jun 21, 1960, 55 y.o.   MRN: [id number]  HPI: Richard LeatherwoodJames L Putzier Jr. is a 55 y.o. male  Chief Complaint  Patient presents with  . Hyperlipidemia    follow up and labs  . Elevated liver enzymes    follow up and labs  . Anxiety    follow up, controlled substance agreement signed  . Labs Only    He is interested in having the HIV and Hep c test    GAD 7 : Generalized Anxiety Score 05/29/2015 02/13/2015  Nervous, Anxious, on Edge 1 1  Control/stop worrying 0 0  Worry too much - different things 1 1  Trouble relaxing 0 0  Restless 0 0  Easily annoyed or irritable 0 0  Afraid - awful might happen 0 0  Total GAD 7 Score 2 2  Anxiety Difficulty Somewhat difficult Somewhat difficult  Patient has panic, agoraphobia, and chronic anxiety d/o; he takes the lorazepam at the start of the workday; he might drink a beer in the evening but never with the lorazepam; he recalls my education with him regarding the dangers of mixing alcohol and benzo Low HDL; he is working out more; has done some weights at work Prediabetes; no one in the family has diabetes to his knowledge 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 Allergies and medications reviewed and updated.  Review of Systems  Respiratory: Negative for shortness of breath.   Cardiovascular: Negative for chest pain.  Gastrointestinal: Negative for blood in stool.  Per HPI unless specifically indicated above     Objective:    BP 115/77 mmHg  Pulse 66   Temp(Src) 97.4 F (36.3 C)  Ht 6\' 2"  (1.88 m)  Wt 236 lb (107.049 kg)  BMI 30.29 kg/m2  SpO2 98%  Wt Readings from Last 3 Encounters:  05/29/15 236 lb (107.049 kg)  02/13/15 235 lb (106.595 kg)  11/06/14 239 lb (108.41 kg)    Physical Exam  Constitutional: He appears well-developed and well-nourished. No distress.  HENT:  Head: Normocephalic and atraumatic.  Eyes: EOM are normal. No scleral icterus.  Neck: No thyromegaly present.  Cardiovascular: Normal rate and regular rhythm.   Pulmonary/Chest: Effort normal and breath sounds normal.  Abdominal: Soft. Bowel sounds are normal. He exhibits no distension. There is no tenderness.  Musculoskeletal: He exhibits no edema.  Neurological: He is alert. He displays no tremor. Coordination and gait normal.  Skin: Skin is warm and dry. No pallor.  Psychiatric: His behavior is normal. Judgment and thought content normal. His mood appears anxious. His affect is not blunt, not labile and not inappropriate. He does not exhibit a depressed mood.  Mildly anxious in appearance; good eye contact with examiner; casually dressed; pleasant and cooperative    Results for orders placed or performed in visit on 02/13/15  Comprehensive metabolic panel  Result Value Ref Range   Glucose 99 65 - 99 mg/dL   BUN  15 6 - 24 mg/dL   Creatinine, Ser 1.61 0.76 - 1.27 mg/dL   GFR calc non Af Amer 77 >59 mL/min/1.73   GFR calc Af Amer 89 >59 mL/min/1.73   BUN/Creatinine Ratio 14 9 - 20   Sodium 144 136 - 144 mmol/L   Potassium 4.4 3.5 - 5.2 mmol/L   Chloride 102 97 - 106 mmol/L   CO2 26 18 - 29 mmol/L   Calcium 9.7 8.7 - 10.2 mg/dL   Total Protein 7.5 6.0 - 8.5 g/dL   Albumin 4.4 3.5 - 5.5 g/dL   Globulin, Total 3.1 1.5 - 4.5 g/dL   Albumin/Globulin Ratio 1.4 1.1 - 2.5   Bilirubin Total 0.3 0.0 - 1.2 mg/dL   Alkaline Phosphatase 66 39 - 117 IU/L   AST 37 0 - 40 IU/L   ALT 48 (H) 0 - 44 IU/L  Lipid Panel w/o Chol/HDL Ratio  Result Value Ref Range    Cholesterol, Total 174 100 - 199 mg/dL   Triglycerides 096 (H) 0 - 149 mg/dL   HDL 39 (L) >04 mg/dL   VLDL Cholesterol Cal 30 5 - 40 mg/dL   LDL Calculated 540 (H) 0 - 99 mg/dL      Assessment & Plan:   Problem List Items Addressed This Visit      Cardiovascular and Mediastinum   Hypertension    Excellent control today        Endocrine   IFG (impaired fasting glucose)    Check A1c today; work on weight management, healthy eating      Relevant Orders   Hgb A1c w/o eAG     Other   Agoraphobia without history of panic disorder    Works at prison; 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; continue sertraline at current dose      Elevated serum glutamic pyruvic transaminase (SGPT) level    Check today; work on weight loss and healthy eating      Vitamin D deficiency    Check level today and supplement if needed      Relevant Orders   VITAMIN D 25 Hydroxy (Vit-D Deficiency, Fractures)   Chronic anxiety - Primary   Relevant Medications   LORazepam (ATIVAN) 1 MG tablet   Low HDL (under 40)    Check fasting lipids today; encouraged pt to keep exercising, work on weight loss      Relevant Orders   Lipid Panel w/o Chol/HDL Ratio   Medication monitoring encounter    Check labs today      Relevant Orders   CBC with Differential/Platelet   Comprehensive metabolic panel   Benzodiazepine use agreement exists    Controlled substance agreement on file; reviewed [place] site; there was a prescription filled for tussionex in January; I talked with patient about this; he was not aware that was a narcotic; I reviewed acute care note; I do not think the patient was doctor shopping and believe that he just wasn't aware this was in violation of the controlled substance agreement; we talked about the danger of unintentional overdose and death from mixing controlled substances and supressing respiratory drive; cautions given; I will give him this one strike, but I explained the next  one is final, I don't give three strikes; he agrees; we reviewed that part of the contract again, reasons to call, always notify other prescribers of the medicine I give him and that he is on a controlled substance contract; refills provided after thorough teaching and agreement  Obesity    Showed pt that he is only 3 pounds away from being out of the obese category and being just overweight; see AVS; goal weight of 210 pounds over the next 4-6 months      Screening for HIV (human immunodeficiency virus)    Discussed one-time HIV screening recommendation per [place]PSTF guidelines; patient agrees with testing; HIV antibody ordered      Relevant Orders   HIV antibody   Need for hepatitis C screening test    Discussed one-time hep C screening recommendation for individuals born between 1945-1965 per USPSTF guidelines; patient agrees with testing; Hep C Ab ordered      Relevant Orders   Hepatitis C antibody   Colon cancer screening    Patient does not want to undergo a colonoscopy; discussed other option; he wishes to proceed with Cologuard      Relevant Orders   Cologuard      Follow up plan: Return in about 3 months (around 08/29/2015) for follow-up at [facility].  Meds ordered this encounter  Medications  . albuterol (PROAIR HFA) 108 (90 Base) MCG/ACT inhaler    Sig: Inhale 2 puffs into the lungs every 6 (six) hours as needed.   [name] LORazepam (ATIVAN) 1 MG tablet    Sig: Take 1 tablet (1 mg total) by mouth every 12 (twelve) hours as needed for anxiety. Do NOT drive; do NOT combine with pain medicine, sleeping pills, cough syrup, or alcohol    Dispense:  40 tablet    Refill:  2   Orders Placed This Encounter  Procedures  . Hepatitis C antibody  . HIV antibody  . CBC with Differential/Platelet  . Hgb A1c w/o eAG  . Lipid Panel w/o Chol/HDL Ratio  . Comprehensive metabolic panel  . VITAMIN D 25 Hydroxy (Vit-D Deficiency, Fractures)  . Cologuard   An after-visit summary was  printed and given to the patient at check-out.  Please see the patient instructions which may contain other information and recommendations beyond what is mentioned above in the assessment and plan.

## 2015-05-29 NOTE — Assessment & Plan Note (Signed)
Patient does not want to undergo a colonoscopy; discussed other option; he wishes to proceed with Cologuard

## 2015-05-29 NOTE — Assessment & Plan Note (Signed)
Showed pt that he is only 3 pounds away from being out of the obese category and being just overweight; see AVS; goal weight of 210 pounds over the next 4-6 months

## 2015-05-30 LAB — LIPID PANEL W/O CHOL/HDL RATIO
CHOLESTEROL TOTAL: 166 mg/dL (ref 100–199)
HDL: 35 mg/dL — AB (ref >39)
LDL CALC: 95 mg/dL (ref 0–99)
Triglycerides: 180 mg/dL — ABNORMAL HIGH (ref 0–149)
VLDL CHOLESTEROL CAL: 36 mg/dL (ref 5–40)

## 2015-05-30 LAB — CBC WITH DIFFERENTIAL/PLATELET
BASOS ABS: 0.1 10*3/uL (ref 0.0–0.2)
BASOS: 1 %
EOS (ABSOLUTE): 0.2 10*3/uL (ref 0.0–0.4)
Eos: 3 %
HEMOGLOBIN: 14.3 g/dL (ref 12.6–17.7)
Hematocrit: 42.2 % (ref 37.5–51.0)
IMMATURE GRANS (ABS): 0 10*3/uL (ref 0.0–0.1)
IMMATURE GRANULOCYTES: 0 %
LYMPHS: 29 %
Lymphocytes Absolute: 2 10*3/uL (ref 0.7–3.1)
MCH: 29.5 pg (ref 26.6–33.0)
MCHC: 33.9 g/dL (ref 31.5–35.7)
MCV: 87 fL (ref 79–97)
MONOCYTES: 8 %
Monocytes Absolute: 0.6 10*3/uL (ref 0.1–0.9)
NEUTROS ABS: 4 10*3/uL (ref 1.4–7.0)
Neutrophils: 59 %
Platelets: 189 10*3/uL (ref 150–379)
RBC: 4.85 x10E6/uL (ref 4.14–5.80)
RDW: 14.2 % (ref 12.3–15.4)
WBC: 6.8 10*3/uL (ref 3.4–10.8)

## 2015-05-30 LAB — COMPREHENSIVE METABOLIC PANEL
A/G RATIO: 1.5 (ref 1.1–2.5)
ALK PHOS: 58 IU/L (ref 39–117)
ALT: 75 IU/L — AB (ref 0–44)
AST: 74 IU/L — AB (ref 0–40)
Albumin: 4.5 g/dL (ref 3.5–5.5)
BUN/Creatinine Ratio: 9 (ref 9–20)
BUN: 9 mg/dL (ref 6–24)
Bilirubin Total: 0.5 mg/dL (ref 0.0–1.2)
CO2: 25 mmol/L (ref 18–29)
CREATININE: 1.04 mg/dL (ref 0.76–1.27)
Calcium: 9.9 mg/dL (ref 8.7–10.2)
Chloride: 100 mmol/L (ref 96–106)
GFR calc Af Amer: 94 mL/min/{1.73_m2} (ref >59)
GFR calc non Af Amer: 82 mL/min/{1.73_m2} (ref >59)
GLOBULIN, TOTAL: 3 g/dL (ref 1.5–4.5)
Glucose: 82 mg/dL (ref 65–99)
POTASSIUM: 4.7 mmol/L (ref 3.5–5.2)
SODIUM: 142 mmol/L (ref 134–144)
Total Protein: 7.5 g/dL (ref 6.0–8.5)

## 2015-05-30 LAB — HGB A1C W/O EAG: HEMOGLOBIN A1C: 5.8 % — AB (ref 4.8–5.6)

## 2015-05-30 LAB — VITAMIN D 25 HYDROXY (VIT D DEFICIENCY, FRACTURES): VIT D 25 HYDROXY: 27.8 ng/mL — AB (ref 30.0–100.0)

## 2015-05-30 LAB — HEPATITIS C ANTIBODY: Hep C Virus Ab: 0.2 s/co ratio (ref 0.0–0.9)

## 2015-05-30 LAB — HIV ANTIBODY (ROUTINE TESTING W REFLEX): HIV Screen 4th Generation wRfx: NONREACTIVE

## 2015-06-02 ENCOUNTER — Telehealth: Payer: Self-pay | Admitting: Family Medicine

## 2015-06-02 DIAGNOSIS — R740 Nonspecific elevation of levels of transaminase and lactic acid dehydrogenase [LDH]: Principal | ICD-10-CM

## 2015-06-02 DIAGNOSIS — R7401 Elevation of levels of liver transaminase levels: Secondary | ICD-10-CM

## 2015-06-02 NOTE — Telephone Encounter (Signed)
I talked with patient Richard [name]; work on weight loss, healthier eating Liver enzymes have gone up; hep C neg; one alcoholic drink a day in the evening or less; STOP alcohol and recheck LFTs in 2 weeks; he agrees Reviewed other labs

## 2015-06-02 NOTE — Assessment & Plan Note (Addendum)
discussed wth patient, no tylenol excess; wt stable; TG 180; drinks one alcoholic drink or less each day; recheck in two weeks; no alcohol in the meantime

## 2015-06-17 ENCOUNTER — Other Ambulatory Visit: Payer: Managed Care, Other (non HMO)

## 2015-06-17 DIAGNOSIS — E786 Lipoprotein deficiency: Secondary | ICD-10-CM

## 2015-06-17 DIAGNOSIS — Z114 Encounter for screening for human immunodeficiency virus [HIV]: Secondary | ICD-10-CM

## 2015-06-17 DIAGNOSIS — R7401 Elevation of levels of liver transaminase levels: Secondary | ICD-10-CM

## 2015-06-17 DIAGNOSIS — Z1159 Encounter for screening for other viral diseases: Secondary | ICD-10-CM

## 2015-06-17 DIAGNOSIS — R740 Nonspecific elevation of levels of transaminase and lactic acid dehydrogenase [LDH]: Secondary | ICD-10-CM

## 2015-06-17 DIAGNOSIS — Z5181 Encounter for therapeutic drug level monitoring: Secondary | ICD-10-CM

## 2015-06-17 DIAGNOSIS — R7301 Impaired fasting glucose: Secondary | ICD-10-CM

## 2015-06-17 DIAGNOSIS — E559 Vitamin D deficiency, unspecified: Secondary | ICD-10-CM

## 2015-06-18 LAB — CBC WITH DIFFERENTIAL/PLATELET
BASOS: 1 %
Basophils Absolute: 0 10*3/uL (ref 0.0–0.2)
EOS (ABSOLUTE): 0.3 10*3/uL (ref 0.0–0.4)
EOS: 4 %
HEMATOCRIT: 39 % (ref 37.5–51.0)
HEMOGLOBIN: 13.5 g/dL (ref 12.6–17.7)
IMMATURE GRANULOCYTES: 0 %
Immature Grans (Abs): 0 10*3/uL (ref 0.0–0.1)
Lymphocytes Absolute: 2.5 10*3/uL (ref 0.7–3.1)
Lymphs: 40 %
MCH: 29.6 pg (ref 26.6–33.0)
MCHC: 34.6 g/dL (ref 31.5–35.7)
MCV: 86 fL (ref 79–97)
MONOS ABS: 0.5 10*3/uL (ref 0.1–0.9)
Monocytes: 9 %
NEUTROS PCT: 46 %
Neutrophils Absolute: 2.9 10*3/uL (ref 1.4–7.0)
Platelets: 169 10*3/uL (ref 150–379)
RBC: 4.56 x10E6/uL (ref 4.14–5.80)
RDW: 13.9 % (ref 12.3–15.4)
WBC: 6.1 10*3/uL (ref 3.4–10.8)

## 2015-06-18 LAB — COMPREHENSIVE METABOLIC PANEL
ALK PHOS: 64 IU/L (ref 39–117)
ALT: 45 IU/L — AB (ref 0–44)
AST: 37 IU/L (ref 0–40)
Albumin/Globulin Ratio: 1.7 (ref 1.2–2.2)
Albumin: 4.3 g/dL (ref 3.5–5.5)
BILIRUBIN TOTAL: 0.3 mg/dL (ref 0.0–1.2)
BUN/Creatinine Ratio: 10 (ref 9–20)
BUN: 9 mg/dL (ref 6–24)
CHLORIDE: 103 mmol/L (ref 96–106)
CO2: 24 mmol/L (ref 18–29)
Calcium: 9.2 mg/dL (ref 8.7–10.2)
Creatinine, Ser: 0.88 mg/dL (ref 0.76–1.27)
GFR calc Af Amer: 113 mL/min/{1.73_m2} (ref >59)
GFR calc non Af Amer: 98 mL/min/{1.73_m2} (ref >59)
GLUCOSE: 87 mg/dL (ref 65–99)
Globulin, Total: 2.5 g/dL (ref 1.5–4.5)
POTASSIUM: 4 mmol/L (ref 3.5–5.2)
Sodium: 143 mmol/L (ref 134–144)
TOTAL PROTEIN: 6.8 g/dL (ref 6.0–8.5)

## 2015-06-18 LAB — VITAMIN D 25 HYDROXY (VIT D DEFICIENCY, FRACTURES): VIT D 25 HYDROXY: 32.6 ng/mL (ref 30.0–100.0)

## 2015-06-18 LAB — LIPID PANEL W/O CHOL/HDL RATIO
Cholesterol, Total: 145 mg/dL (ref 100–199)
HDL: 28 mg/dL — AB (ref >39)
LDL CALC: 55 mg/dL (ref 0–99)
TRIGLYCERIDES: 310 mg/dL — AB (ref 0–149)
VLDL CHOLESTEROL CAL: 62 mg/dL — AB (ref 5–40)

## 2015-06-18 LAB — HEPATITIS C ANTIBODY: HCV Ab: 0.2 s/co ratio (ref 0.0–0.9)

## 2015-06-18 LAB — HEPATIC FUNCTION PANEL: BILIRUBIN, DIRECT: 0.08 mg/dL (ref 0.00–0.40)

## 2015-06-18 LAB — HGB A1C W/O EAG: Hgb A1c MFr Bld: 6 % — ABNORMAL HIGH (ref 4.8–5.6)

## 2015-06-18 LAB — HIV ANTIBODY (ROUTINE TESTING W REFLEX): HIV Screen 4th Generation wRfx: NONREACTIVE

## 2015-08-17 ENCOUNTER — Encounter: Payer: Self-pay | Admitting: Physician Assistant

## 2015-08-17 ENCOUNTER — Ambulatory Visit: Payer: Self-pay | Admitting: Physician Assistant

## 2015-08-17 VITALS — BP 110/73 | HR 97 | Temp 98.2°F

## 2015-08-17 DIAGNOSIS — J069 Acute upper respiratory infection, unspecified: Secondary | ICD-10-CM

## 2015-08-17 MED ORDER — ALBUTEROL SULFATE HFA 108 (90 BASE) MCG/ACT IN AERS: 2.0000 | INHALATION_SPRAY | Freq: Four times a day (QID) | RESPIRATORY_TRACT | Status: DC | PRN

## 2015-08-17 MED ORDER — AZITHROMYCIN 250 MG PO TABS: ORAL_TABLET | ORAL | Status: DC

## 2015-08-17 NOTE — Progress Notes (Signed)
S: C/o runny nose and congestion with dry cough for 7 days, no fever, chills, cp/sob, v/d; mucus was green this am but clear throughout the day, cough is sporadic, has some wheezing at night  O: PE: vitals wnl nad, perrl eomi, normocephalic, tms dull, nasal mucosa red and swollen, throat injected, neck supple no lymph, lungs c t a, cv rrr, neuro intact  A:  Acute  uri   P: zpack, albuterol inhaler, drink fluids, continue regular meds , use otc meds of choice, return if not improving in 5 days, return earlier if worsening

## 2015-09-10 ENCOUNTER — Ambulatory Visit (INDEPENDENT_AMBULATORY_CARE_PROVIDER_SITE_OTHER): Payer: Managed Care, Other (non HMO) | Admitting: Family Medicine

## 2015-09-10 ENCOUNTER — Encounter: Payer: Self-pay | Admitting: Family Medicine

## 2015-09-10 VITALS — BP 120/76 | HR 84 | Temp 98.3°F | Resp 16 | Ht 74.0 in | Wt 216.2 lb

## 2015-09-10 DIAGNOSIS — R0683 Snoring: Secondary | ICD-10-CM | POA: Diagnosis not present

## 2015-09-10 DIAGNOSIS — I10 Essential (primary) hypertension: Secondary | ICD-10-CM

## 2015-09-10 DIAGNOSIS — F419 Anxiety disorder, unspecified: Secondary | ICD-10-CM

## 2015-09-10 DIAGNOSIS — E786 Lipoprotein deficiency: Secondary | ICD-10-CM | POA: Diagnosis not present

## 2015-09-10 DIAGNOSIS — R7301 Impaired fasting glucose: Secondary | ICD-10-CM | POA: Diagnosis not present

## 2015-09-10 DIAGNOSIS — K760 Fatty (change of) liver, not elsewhere classified: Secondary | ICD-10-CM

## 2015-09-10 DIAGNOSIS — R7401 Elevation of levels of liver transaminase levels: Secondary | ICD-10-CM

## 2015-09-10 DIAGNOSIS — Z79899 Other long term (current) drug therapy: Secondary | ICD-10-CM

## 2015-09-10 DIAGNOSIS — F4002 Agoraphobia without panic disorder: Secondary | ICD-10-CM

## 2015-09-10 DIAGNOSIS — E663 Overweight: Secondary | ICD-10-CM

## 2015-09-10 DIAGNOSIS — R740 Nonspecific elevation of levels of transaminase and lactic acid dehydrogenase [LDH]: Secondary | ICD-10-CM

## 2015-09-10 DIAGNOSIS — E781 Pure hyperglyceridemia: Secondary | ICD-10-CM | POA: Diagnosis not present

## 2015-09-10 HISTORY — DX: Pure hyperglyceridemia: E78.1

## 2015-09-10 HISTORY — DX: Fatty (change of) liver, not elsewhere classified: K76.0

## 2015-09-10 HISTORY — DX: Snoring: R06.83

## 2015-09-10 NOTE — Patient Instructions (Addendum)
Cut back on potatoes and bread Start fish oil or krill oil twice a day  Your goal blood pressure is less than 140 mmHg on top. Try to follow the DASH guidelines (DASH stands for Dietary Approaches to Stop Hypertension) Try to limit the sodium in your diet.  Ideally, consume less than 1.5 grams (less than 1,500mg ) per day. Do not add salt when cooking or at the table.  Check the sodium amount on labels when shopping, and choose items lower in sodium when given a choice. Avoid or limit foods that already contain a lot of sodium. Eat a diet rich in fruits and vegetables and whole grains.  Try to limit saturated fats in your diet (bologna, hot dogs, barbeque, cheeseburgers, hamburgers, steak, bacon, sausage, cheese, etc.) and get more fresh fruits, vegetables, and whole grains  Keep up the great job with your weight loss efforts!  We'll have you see Dr. Meredeth IdeFleming about possible sleep apnea

## 2015-09-10 NOTE — Assessment & Plan Note (Signed)
Intentional weight loss, praised patient for his efforts; he is going to try to get to 210 pounds next

## 2015-09-10 NOTE — Assessment & Plan Note (Signed)
Excellent control; encouraged pt in his weight loss efforts; DASH guidelines

## 2015-09-10 NOTE — Assessment & Plan Note (Signed)
Patient's [place] is consistent, no red flags; okay to refill benzo; new contract signed, UDS collected today

## 2015-09-10 NOTE — Assessment & Plan Note (Signed)
Last A1c reviewed from March, next due in late Sept

## 2015-09-10 NOTE — Assessment & Plan Note (Signed)
Praised patient for intentional weight loss; recheck lipids in late Sept

## 2015-09-10 NOTE — Assessment & Plan Note (Signed)
Cut back on starches, sugary foods/drinks; start krill oil or fish oil BID; recheck lipids fasting in late Sept

## 2015-09-10 NOTE — Assessment & Plan Note (Signed)
May continue PRN benzo

## 2015-09-10 NOTE — Assessment & Plan Note (Signed)
Reviewed last number, fatty liver on [place] 2015; continue weight loss efforts, healthy eating

## 2015-09-10 NOTE — Assessment & Plan Note (Signed)
Noted on [place] 2015; praised pt for intentional weight loss; recheck LFTs in late Sept

## 2015-09-10 NOTE — Progress Notes (Signed)
BP 120/76 mmHg  Pulse 84  Temp(Src) 98.3 F (36.8 C) (Oral)  Resp 16  Ht 6\' 2"  (1.88 m)  Wt 216 lb 3.2 oz (98.068 kg)  BMI 27.75 kg/m2  SpO2 98%   Subjective:    Patient ID: Richard LeatherwoodJames L Harold Jr., male    DOB: February 18, 1961, 55 y.o.   MRN: [id number]  HPI: Richard LeatherwoodJames L Vaccaro Jr. is a 55 y.o. male  Chief Complaint  Patient presents with  . Anxiety    3 month follow up med refill   Patient is here for 3 month follow-up  He is losing weight on purpose; has dropped 20 pounds by eating better, smaller portions; not worried about weight loss  Anxiety is doing good; he still feels like the medicine is necessary for his anxiety; did counseling in the past; they listened; he made rank, Corporal now; responsible for all the officers; handling the extra responsibility okay  He is having issues with sleep; works nights for the last 2 weeks; sometimes has trouble going to sleep, feeling tired sometimes; he has been told that when he sleeps, he snores and has gasps for air; no other breathing issues; father has sleep apnea  He stopped the Abilify on his own a few months ago; no change in mood  Lots of labs done in March, reviewed  Prediabetes; next A1c due in late Sept High TG; 310 in March; trying to avoid sugary things; does eat some potatoes; not taking any fish oil  Depression screen PHQ 2/9 09/10/2015  Decreased Interest 0  Down, Depressed, Hopeless 0  PHQ - 2 Score 0    GAD 7 : Generalized Anxiety Score 05/29/2015 02/13/2015  Nervous, Anxious, on Edge 1 1  Control/stop worrying 0 0  Worry too much - different things 1 1  Trouble relaxing 0 0  Restless 0 0  Easily annoyed or irritable 0 0  Afraid - awful might happen 0 0  Total GAD 7 Score 2 2  Anxiety Difficulty Somewhat difficult Somewhat difficult    Relevant past medical, surgical, family and social history reviewed Past Medical History  Diagnosis Date  . Kidney stone   . IFG (impaired fasting glucose)   . Agoraphobia without  history of panic disorder   . Elevated serum glutamic pyruvic transaminase (SGPT) level   . Vitamin D deficiency   . Hypertension   . Anxiety   . Depression   . Low HDL (under 40)   . Snoring 09/10/2015  . Overweight (BMI 25.0-29.9) 11/06/2014  . High triglycerides 09/10/2015  . Fatty liver 09/10/2015   Past Surgical History  Procedure Laterality Date  . Nasal sinus surgery  2008   Family History  Problem Relation Age of Onset  . Heart disease Father     heart attack age 53  . Heart attack Father   . Hyperlipidemia Father   . Sleep apnea Father   . Cancer Brother     hodgins, liver cancer   Social History  Substance Use Topics  . Smoking status: Former Smoker -- 1.00 packs/day for 18 years    Types: Cigarettes    Quit date: 03/28/1990  . Smokeless tobacco: Current User  . Alcohol Use: 1.2 oz/week    2 Cans of beer per week     Comment: occasionally   Interim medical history since last visit reviewed. Allergies and medications reviewed  Review of Systems Per HPI unless specifically indicated above     Objective:    BP  120/76 mmHg  Pulse 84  Temp(Src) 98.3 F (36.8 C) (Oral)  Resp 16  Ht 6\' 2"  (1.88 m)  Wt 216 lb 3.2 oz (98.068 kg)  BMI 27.75 kg/m2  SpO2 98%  Wt Readings from Last 3 Encounters:  09/10/15 216 lb 3.2 oz (98.068 kg)  05/29/15 236 lb (107.049 kg)  02/13/15 235 lb (106.595 kg)    Physical Exam  Constitutional: He appears well-developed and well-nourished. No distress.  HENT:  Head: Normocephalic and atraumatic.  Mouth/Throat: Oropharynx is clear and moist and mucous membranes are normal. No posterior oropharyngeal edema or posterior oropharyngeal erythema.  mallampati I  Eyes: EOM are normal. No scleral icterus.  Neck: No thyromegaly present.  Cardiovascular: Normal rate and regular rhythm.   Pulmonary/Chest: Effort normal and breath sounds normal.  Abdominal: Soft. Bowel sounds are normal. He exhibits no distension.  Musculoskeletal: He  exhibits no edema.  Neurological: He is alert.  Skin: Skin is warm and dry. No pallor.  Psychiatric: He has a normal mood and affect. His behavior is normal. Judgment and thought content normal. His mood appears not anxious. His affect is not blunt. He is not slowed. Cognition and memory are not impaired. He does not exhibit a depressed mood.  Quiet, polite, good eye contact He is attentive.    Results for orders placed or performed in visit on 06/17/15  Hepatitis C antibody  Result Value Ref Range   HCV Ab 0.2 0.0 - 0.9 s/co ratio  HIV antibody  Result Value Ref Range   HIV Screen 4th Generation wRfx Non Reactive Non Reactive  CBC with Differential/Platelet  Result Value Ref Range   WBC 6.1 3.4 - 10.8 x10E3/uL   RBC 4.56 4.14 - 5.80 x10E6/uL   Hemoglobin 13.5 12.6 - 17.7 g/dL   Hematocrit 16.1 09.6 - 51.0 %   MCV 86 79 - 97 fL   MCH 29.6 26.6 - 33.0 pg   MCHC 34.6 31.5 - 35.7 g/dL   RDW 04.5 40.9 - 81.1 %   Platelets 169 150 - 379 x10E3/uL   Neutrophils 46 %   Lymphs 40 %   Monocytes 9 %   Eos 4 %   Basos 1 %   Neutrophils Absolute 2.9 1.4 - 7.0 x10E3/uL   Lymphocytes Absolute 2.5 0.7 - 3.1 x10E3/uL   Monocytes Absolute 0.5 0.1 - 0.9 x10E3/uL   EOS (ABSOLUTE) 0.3 0.0 - 0.4 x10E3/uL   Basophils Absolute 0.0 0.0 - 0.2 x10E3/uL   Immature Granulocytes 0 %   Immature Grans (Abs) 0.0 0.0 - 0.1 x10E3/uL  Hgb A1c w/o eAG  Result Value Ref Range   Hgb A1c MFr Bld 6.0 (H) 4.8 - 5.6 %  Lipid Panel w/o Chol/HDL Ratio  Result Value Ref Range   Cholesterol, Total 145 100 - 199 mg/dL   Triglycerides 914 (H) 0 - 149 mg/dL   HDL 28 (L) >78 mg/dL   VLDL Cholesterol Cal 62 (H) 5 - 40 mg/dL   LDL Calculated 55 0 - 99 mg/dL  Comprehensive metabolic panel  Result Value Ref Range   Glucose 87 65 - 99 mg/dL   BUN 9 6 - 24 mg/dL   Creatinine, Ser 2.95 0.76 - 1.27 mg/dL   GFR calc non Af Amer 98 >59 mL/min/1.73   GFR calc Af Amer 113 >59 mL/min/1.73   BUN/Creatinine Ratio 10 9 - 20    Sodium 143 134 - 144 mmol/L   Potassium 4.0 3.5 - 5.2 mmol/L   Chloride 103  96 - 106 mmol/L   CO2 24 18 - 29 mmol/L   Calcium 9.2 8.7 - 10.2 mg/dL   Total Protein 6.8 6.0 - 8.5 g/dL   Albumin 4.3 3.5 - 5.5 g/dL   Globulin, Total 2.5 1.5 - 4.5 g/dL   Albumin/Globulin Ratio 1.7 1.2 - 2.2   Bilirubin Total 0.3 0.0 - 1.2 mg/dL   Alkaline Phosphatase 64 39 - 117 IU/L   AST 37 0 - 40 IU/L   ALT 45 (H) 0 - 44 IU/L  VITAMIN D 25 Hydroxy (Vit-D Deficiency, Fractures)  Result Value Ref Range   Vit D, 25-Hydroxy 32.6 30.0 - 100.0 ng/mL  Hepatic function panel  Result Value Ref Range   Bilirubin, Direct 0.08 0.00 - 0.40 mg/dL      Assessment & Plan:   Problem List Items Addressed This Visit      Cardiovascular and Mediastinum   Hypertension    Excellent control; encouraged pt in his weight loss efforts; DASH guidelines        Digestive   Fatty liver    Noted on [place] 2015; praised pt for intentional weight loss; recheck LFTs in late Sept        Endocrine   IFG (impaired fasting glucose)    Last A1c reviewed from March, next due in late Sept        Other   Agoraphobia without history of panic disorder    May continue PRN benzo      Chronic anxiety    Patient's [place]CCS[place] is consistent, no red flags; okay to refill benzo; new contract signed, UDS collected today      Controlled substance agreement signed    Discussed risk of controlled substances including possible unintentional overdose, especially if mixed with alcohol or other pills; typical speech given including illegal to share, even out of the goodness of patient's heart, always keep in the original bottle, safeguard medicine, do NOT mix with alcohol, other pain pills, "nerve" or anxiety pills, or sleeping pills; I am not obligated to approve of early refill or give new prescription if medicine is lost, stolen, or destroyed even with a police report, etc.; patient agrees with plan; controlled substance contract signed; copy of  contract given to patient; [place] site reviewed; last benzo Rxs filled on 08/25/15, 07/03/15, 05/07/15      Elevated serum glutamic pyruvic transaminase (SGPT) level    Reviewed last number, fatty liver on [place] 2015; continue weight loss efforts, healthy eating      High triglycerides    Cut back on starches, sugary foods/drinks; start krill oil or fish oil BID; recheck lipids fasting in late Sept      Low HDL (under 40)    Praised patient for intentional weight loss; recheck lipids in late Sept      Overweight (BMI 25.0-29.9)    Intentional weight loss, praised patient for his efforts; he is going to try to get to 210 pounds next      Snoring - Primary    Suspicious for sleep apnea; will refer him back to Dr. Meredeth Ide for evaluation of possible OSA; continue weight loss      Relevant Orders   Ambulatory referral to Pulmonology      Follow up plan: Return in about 3 months (around 12/21/2015) for fasting labs and visit.  An after-visit summary was printed and given to the patient at check-out.  Please see the patient instructions which may contain other information and recommendations beyond what is  mentioned above in the assessment and plan.  Orders Placed This Encounter  Procedures  . Ambulatory referral to Pulmonology

## 2015-09-10 NOTE — Assessment & Plan Note (Signed)
Suspicious for sleep apnea; will refer him back to Dr. Meredeth IdeFleming for evaluation of possible OSA; continue weight loss

## 2015-09-10 NOTE — Assessment & Plan Note (Addendum)
Discussed risk of controlled substances including possible unintentional overdose, especially if mixed with alcohol or other pills; typical speech given including illegal to share, even out of the goodness of patient's heart, always keep in the original bottle, safeguard medicine, do NOT mix with alcohol, other pain pills, "nerve" or anxiety pills, or sleeping pills; I am not obligated to approve of early refill or give new prescription if medicine is lost, stolen, or destroyed even with a police report, etc.; patient agrees with plan; controlled substance contract signed; copy of contract given to patient; [place]CC[place] site reviewed; last benzo Rxs filled on 08/25/15, 07/03/15, 05/07/15

## 2015-09-22 ENCOUNTER — Encounter: Payer: Self-pay | Admitting: Family Medicine

## 2015-10-03 ENCOUNTER — Encounter: Payer: Self-pay | Admitting: Emergency Medicine

## 2015-10-03 ENCOUNTER — Emergency Department
Admission: EM | Admit: 2015-10-03 | Discharge: 2015-10-03 | Disposition: A | Discharge status: Home / Self Care | Payer: Managed Care, Other (non HMO) | Attending: Emergency Medicine | Admitting: Emergency Medicine

## 2015-10-03 DIAGNOSIS — I10 Essential (primary) hypertension: Secondary | ICD-10-CM | POA: Diagnosis not present

## 2015-10-03 DIAGNOSIS — F329 Major depressive disorder, single episode, unspecified: Secondary | ICD-10-CM | POA: Diagnosis not present

## 2015-10-03 DIAGNOSIS — Z79899 Other long term (current) drug therapy: Secondary | ICD-10-CM | POA: Diagnosis not present

## 2015-10-03 DIAGNOSIS — R55 Syncope and collapse: Secondary | ICD-10-CM

## 2015-10-03 DIAGNOSIS — Z87891 Personal history of nicotine dependence: Secondary | ICD-10-CM | POA: Diagnosis not present

## 2015-10-03 LAB — URINALYSIS COMPLETE WITH MICROSCOPIC (ARMC ONLY)
BACTERIA UA: NONE SEEN
BILIRUBIN URINE: NEGATIVE
GLUCOSE, UA: NEGATIVE mg/dL
HGB URINE DIPSTICK: NEGATIVE
Ketones, ur: NEGATIVE mg/dL
LEUKOCYTES UA: NEGATIVE
NITRITE: NEGATIVE
Protein, ur: 30 mg/dL — AB
SPECIFIC GRAVITY, URINE: 1.023 (ref 1.005–1.030)
Squamous Epithelial / LPF: NONE SEEN
pH: 5 (ref 5.0–8.0)

## 2015-10-03 LAB — BASIC METABOLIC PANEL
Anion gap: 8 (ref 5–15)
BUN: 12 mg/dL (ref 6–20)
CALCIUM: 9.3 mg/dL (ref 8.9–10.3)
CO2: 24 mmol/L (ref 22–32)
CREATININE: 0.82 mg/dL (ref 0.61–1.24)
Chloride: 106 mmol/L (ref 101–111)
GFR calc Af Amer: >60 mL/min (ref >60)
GLUCOSE: 115 mg/dL — AB (ref 65–99)
POTASSIUM: 3.9 mmol/L (ref 3.5–5.1)
SODIUM: 138 mmol/L (ref 135–145)

## 2015-10-03 LAB — CBC
HEMATOCRIT: 39.5 % — AB (ref 40.0–52.0)
Hemoglobin: 13.5 g/dL (ref 13.0–18.0)
MCH: 28.8 pg (ref 26.0–34.0)
MCHC: 34.1 g/dL (ref 32.0–36.0)
MCV: 84.5 fL (ref 80.0–100.0)
PLATELETS: 231 10*3/uL (ref 150–440)
RBC: 4.68 MIL/uL (ref 4.40–5.90)
RDW: 13.3 % (ref 11.5–14.5)
WBC: 11.6 10*3/uL — AB (ref 3.8–10.6)

## 2015-10-03 LAB — GLUCOSE, CAPILLARY: GLUCOSE-CAPILLARY: 121 mg/dL — AB (ref 65–99)

## 2015-10-03 LAB — TROPONIN I: Troponin I: <0.03 ng/mL (ref <0.03)

## 2015-10-03 NOTE — ED Provider Notes (Addendum)
[facility] Emergency Department Provider Note  ____________________________________________  Time seen: Approximately 930 PM  I have reviewed the triage vital signs and the nursing notes.   HISTORY  Chief Complaint Loss of Consciousness and Dizziness  HPI Richard [name]. is a 55 y.o. male with a history of kidney stones as well as anxiety, depression and agoraphobia who is presenting to the emergency department today after a near syncopal episode. The patient says that he was lying down when he heard a knock at his door. He then got up to answer his door and began feeling lightheaded. He then, after closing the door, began to feel diaphoretic and lowered himself to the ground onto his bottom. He did not hit his head. It is unclear whether he actually lost consciousness. The episode was witnessed by his significant other who says that he did lose consciousness was only for several seconds. The patient denies any chest pain or shortness of breath. Says that he has been feeling very anxious lately due to family stressors.  Does not report any suicidal or homicidal ideation. Patient did lose urinary continence but did not have any noted seizure activity. He was very confused but then returned to his baseline within several minutes. His significant other took his blood pressure while he was confused and was found to be 174/102. Patient at this time has no complaints. Says that he has similar episode that happened several years ago. Discrepancy between the triage note which lists a loss of consciousness between 30 seconds and 1 minute. Patient denied any sensation of the room spinning but says that he felt more lightheaded. Reports a mild low back pain since the episode. Has been able to ambulate since the episode.   Past Medical History  Diagnosis Date  . Kidney stone   . IFG (impaired fasting glucose)   . Agoraphobia without history of panic disorder   . Elevated serum  glutamic pyruvic transaminase (SGPT) level   . Vitamin D deficiency   . Hypertension   . Anxiety   . Depression   . Low HDL (under 40)   . Snoring 09/10/2015  . Overweight (BMI 25.0-29.9) 11/06/2014  . High triglycerides 09/10/2015  . Fatty liver 09/10/2015    Patient Active Problem List   Diagnosis Date Noted  . Snoring 09/10/2015  . High triglycerides 09/10/2015  . Fatty liver 09/10/2015  . Screening for HIV (human immunodeficiency virus) 05/29/2015  . Need for hepatitis C screening test 05/29/2015  . Colon cancer screening 05/29/2015  . Controlled substance agreement signed 02/13/2015  . Snores 02/13/2015  . Medication monitoring encounter 11/06/2014  . Benzodiazepine use agreement exists 11/06/2014  . Overweight (BMI 25.0-29.9) 11/06/2014  . Allergic rhinitis 11/06/2014  . Anxiety   . Low HDL (under 40)   . Chronic anxiety 10/21/2014  . Hypertension 07/15/2014  . Depression 07/15/2014  . IFG (impaired fasting glucose)   . Agoraphobia without history of panic disorder   . Elevated serum glutamic pyruvic transaminase (SGPT) level   . Vitamin D deficiency     Past Surgical History  Procedure Laterality Date  . Nasal sinus surgery  2008    Current Outpatient Rx  Name  Route  Sig  Dispense  Refill  . albuterol (PROAIR HFA) 108 (90 Base) MCG/ACT inhaler   Inhalation   Inhale 2 puffs into the lungs every 6 (six) hours as needed.   1 Inhaler   12   . azelastine (ASTELIN) 0.1 %  nasal spray   Each Nare   Place into both nostrils 2 (two) times daily. Use in each nostril as directed         . cholecalciferol (VITAMIN D) 1000 UNITS tablet   Oral   Take 1,000 Units by mouth daily.         . fexofenadine (ALLEGRA) 180 MG tablet   Oral   Take 180 mg by mouth daily.         [name] LORazepam (ATIVAN) 1 MG tablet   Oral   Take 1 tablet (1 mg total) by mouth every 12 (twelve) hours as needed for anxiety. Do NOT drive; do NOT combine with pain medicine, sleeping pills,  cough syrup, or alcohol   40 tablet   2   . Multiple Vitamin (MULTIVITAMIN) tablet   Oral   Take 1 tablet by mouth daily.         . sertraline (ZOLOFT) 100 MG tablet   Oral   Take 1 tablet (100 mg total) by mouth daily.   30 tablet   5   . vitamin B-12 (CYANOCOBALAMIN) 1000 MCG tablet   Oral   Take 1,000 mcg by mouth daily.            Allergies Review of patient's allergies indicates no known allergies.  Family History  Problem Relation Age of Onset  . Heart disease Father     heart attack age 37  . Heart attack Father   . Hyperlipidemia Father   . Sleep apnea Father   . Cancer Brother     hodgins, liver cancer    Social History Social History  Substance Use Topics  . Smoking status: Former Smoker -- 1.00 packs/day for 18 years    Types: Cigarettes    Quit date: 03/28/1990  . Smokeless tobacco: Current User  . Alcohol Use: No     Comment: occasionally    Review of Systems Constitutional: No fever/chills Eyes: No visual changes. ENT: No sore throat. Cardiovascular: Denies chest pain. Respiratory: Denies shortness of breath. Gastrointestinal: No abdominal pain.  No nausea, no vomiting.  No diarrhea.  No constipation. Genitourinary: Negative for dysuria. Musculoskeletal: As above Skin: Negative for rash. Neurological: Negative for headaches, focal weakness or numbness.  10-point ROS otherwise negative.  ____________________________________________   PHYSICAL EXAM:  VITAL SIGNS: ED Triage Vitals  Enc Vitals Group     BP 10/03/15 1546 103/62 mmHg     Pulse Rate 10/03/15 1546 91     Resp 10/03/15 1546 18     Temp 10/03/15 1546 98.7 F (37.1 C)     Temp Source 10/03/15 1546 Oral     SpO2 10/03/15 1546 98 %     Weight 10/03/15 1546 215 lb (97.523 kg)     Height 10/03/15 1546  (1.905 m)     Head Cir --      Peak Flow --      Pain Score 10/03/15 1548 4     Pain Loc --      Pain Edu? --      Excl. in GC? --     Constitutional: Alert  and oriented. Well appearing and in no acute distress. Eyes: Conjunctivae are normal. PERRL. EOMI. Head: Atraumatic. Nose: No congestion/rhinnorhea. Mouth/Throat: Mucous membranes are moist.   Neck: No stridor.   Cardiovascular: Normal rate, regular rhythm. Grossly normal heart sounds.  Equal and bilateral radial pulses. Respiratory: Normal respiratory effort.  No retractions. Lungs CTAB. Gastrointestinal: Soft and nontender. No  distention. No abdominal bruits. No CVA tenderness. Musculoskeletal: No lower extremity tenderness nor edema.  No joint effusions. 5 out of 5 strength bilateral lower extremities. Pelvis is nontender and stable. Neurologic:  Normal speech and language. No gross focal neurologic deficits are appreciated. No gait instability. Skin:  Skin is warm, dry and intact. No rash noted. Psychiatric: Mood and affect are normal. Speech and behavior are normal.  ____________________________________________   LABS (all labs ordered are listed, but only abnormal results are displayed)  Labs Reviewed  GLUCOSE, CAPILLARY - Abnormal; Notable for the following:    Glucose-Capillary 121 (*)    All other components within normal limits  BASIC METABOLIC PANEL - Abnormal; Notable for the following:    Glucose, Bld 115 (*)    All other components within normal limits  CBC - Abnormal; Notable for the following:    WBC 11.6 (*)    HCT 39.5 (*)    All other components within normal limits  URINALYSIS COMPLETEWITH MICROSCOPIC ([facility] ONLY) - Abnormal; Notable for the following:    Color, Urine AMBER (*)    APPearance HAZY (*)    Protein, ur 30 (*)    All other components within normal limits  TROPONIN I  TROPONIN I  CBG MONITORING, ED   ____________________________________________  EKG  ED ECG REPORT I, Arelia LongestSchaevitz,  Amiayah Giebel M, the attending physician, personally viewed and interpreted this ECG.   Date: 10/03/2015  EKG Time: 1557  Rate: 90  Rhythm: normal sinus rhythm  Axis:  Normal  Intervals:none  ST&T Change: No ST segment elevation or depression. No abnormal T-wave inversion.  ____________________________________________  RADIOLOGY   ____________________________________________   PROCEDURES  Procedures  ____________________________________________   INITIAL IMPRESSION / ASSESSMENT AND PLAN / ED COURSE  Pertinent labs & imaging results that were available during my care of the patient were reviewed by me and considered in my medical decision making (see chart for details).  ----------------------------------------- 10:47 PM on 10/03/2015 -----------------------------------------  Patient remains at his baseline without any complaints at this time. He has had a relatively low blood pressure while in the emergency department in the 1 teens. It appears that this is his baseline. If this is baseline blood pressure it is possible that he could've become hypotensive after standing from lying down and had a near-syncopal episode. This is most consistent with his story. Although he did have urinary incontinence, It does not sound like a seizure. Patient will follow-up with his primary care doctor. ____________________________________________   FINAL CLINICAL IMPRESSION(S) / ED DIAGNOSES  Syncope.    NEW MEDICATIONS STARTED DURING THIS VISIT:  New Prescriptions   No medications on file     Note:  This document was prepared using Dragon voice recognition software and may include unintentional dictation errors.    Myrna Blazeravid Matthew Danissa Rundle, MD 10/03/15 2248  Patient with likely syncopal episode is likely a combination of vasovagal as well as family stressors. He is requesting a work note to return to work this Wednesday.  Myrna Blazeravid Matthew Troyce Gieske, MD 10/03/15 2250

## 2015-10-03 NOTE — ED Notes (Signed)
Pt presents to ED post syncopal episode around 1500; witness by his wife. Pt states he had a sudden onset of dizziness and fell to the ground. + was incontinent of urine, pale, and "extreemly diaphoretic" per his wife. Pt "did not hit his head". Loc between 30 sec and 1 min. currently has no complaints of dizziness or chest pain. Reports mild lower back pain.

## 2015-10-03 NOTE — ED Notes (Signed)
Blood pressure at home post syncopal episode 174/102 HR 79 and 10 minutes later 150/84 HR 79

## 2015-10-03 NOTE — ED Notes (Signed)
Pt. States he has had episodes where he felt dizzy, Pt. States this was the first time he lost consciousness due to feeling dizzy.  Pt. Denies he takes seizure medication.

## 2015-10-03 NOTE — ED Notes (Signed)
Pt unable to give urine sample at this time; pt has specimen cup. 

## 2015-10-03 NOTE — Discharge Instructions (Signed)

## 2015-10-06 ENCOUNTER — Encounter: Payer: Self-pay | Admitting: Family Medicine

## 2015-10-06 ENCOUNTER — Ambulatory Visit (INDEPENDENT_AMBULATORY_CARE_PROVIDER_SITE_OTHER): Payer: Managed Care, Other (non HMO) | Admitting: Family Medicine

## 2015-10-06 VITALS — BP 118/84 | HR 103 | Temp 97.8°F | Resp 18 | Wt 211.6 lb

## 2015-10-06 DIAGNOSIS — R55 Syncope and collapse: Secondary | ICD-10-CM | POA: Insufficient documentation

## 2015-10-06 DIAGNOSIS — R0683 Snoring: Secondary | ICD-10-CM | POA: Diagnosis not present

## 2015-10-06 DIAGNOSIS — F419 Anxiety disorder, unspecified: Secondary | ICD-10-CM

## 2015-10-06 DIAGNOSIS — R809 Proteinuria, unspecified: Secondary | ICD-10-CM

## 2015-10-06 LAB — POCT URINALYSIS DIPSTICK
Bilirubin, UA: NEGATIVE
Blood, UA: NEGATIVE
GLUCOSE UA: NEGATIVE
Ketones, UA: NEGATIVE
LEUKOCYTES UA: NEGATIVE
NITRITE UA: NEGATIVE
Protein, UA: NEGATIVE
Spec Grav, UA: 1.02
UROBILINOGEN UA: 0.2
pH, UA: 5.5

## 2015-10-06 NOTE — Progress Notes (Signed)
BP 118/84   Pulse (!) 103   Temp 97.8 F (36.6 C) (Oral)   Resp 18   Wt 211 lb 9.6 oz (96 kg)   SpO2 95%   BMI 26.45 kg/m    Subjective:    Patient ID: Richard [name]., male    DOB: May 26, 1960, 55 y.o.   MRN: [id number]  HPI: Richard [name] is a 55 y.o. male  Chief Complaint  Patient presents with  . Follow-up    ER syncope  patient here for ER f/u He talked to two people at the door; hot outside, 3-4 minutes, not emotionally charged; had eaten a few hours No heart issues or symptoms that patient recalls No seizures; blood sugar 121 Incontinent of urine when he passed out; cleaned him out Father has bigeminy; has had heart attack and bypass surgery; heart issues started around age 61; father has passed out in the past; doesn't happen often; he got up and got dizzy and fell out; gets up  At lot of stress at work; patient has left his wife; step-mother wants his estranged wife to file neglect charges After patient passed out, white and ashy; where am I and what happened, just a few minutes He realized he wet on himself He would get similar symptoms in the past, but never got to the point of actual passing; one over the two years; he had 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 Meredeth Ide (pulmonologist); he did the breathing tests with flying colors he says  Depression screen [facility] 2/9 10/06/2015 09/10/2015  Decreased Interest 0 0  Down, Depressed, Hopeless 0 0  PHQ - 2 Score 0 0   Relevant past medical, surgical, family and social history reviewed Past Medical History:  Diagnosis Date  . Agoraphobia without history of panic disorder   . Anxiety   . Depression   . Dizziness   . Elevated serum glutamic pyruvic transaminase (SGPT)  level   . Fatty liver 09/10/2015  . High triglycerides 09/10/2015  . Hypertension   . IFG (impaired fasting glucose)   . Kidney stone   . Low HDL (under 40)   . Overweight (BMI 25.0-29.9) 11/06/2014  . Snoring 09/10/2015  . Vitamin D deficiency    Past Surgical History:  Procedure Laterality Date  . NASAL SINUS SURGERY  2008   Family History  Problem Relation Age of Onset  . Heart disease Father     heart attack age 75  . Heart attack Father   . Hyperlipidemia Father   . Sleep apnea Father   . Cancer Brother     hodgins, liver cancer  brother - cancer around his bile duct sister - lung cancer, stage 4, spread to the liver and bones and maybe brain  Social History  Substance Use Topics  . Smoking status: Former Smoker    Packs/day: 1.00    Years: 18.00    Types: Cigarettes    Quit date: 03/28/1990  . Smokeless tobacco: Current User  . Alcohol use No     Comment: occasionally   Interim medical history since last visit reviewed. Allergies and medications reviewed  Review of Systems Per HPI unless specifically indicated above     Objective:  BP 118/84   Pulse (!) 103   Temp 97.8 F (36.6 C) (Oral)   Resp 18   Wt 211 lb 9.6 oz (96 kg)   SpO2 95%   BMI 26.45 kg/m   Wt Readings from Last 3 Encounters:  10/06/15 211 lb 9.6 oz (96 kg)  10/03/15 215 lb (97.5 kg)  09/10/15 216 lb 3.2 oz (98.1 kg)    Physical Exam  Constitutional: He appears well-developed and well-nourished. No distress.  HENT:  Head: Normocephalic and atraumatic.  Eyes: EOM are normal. No scleral icterus.  Neck: No JVD present. Carotid bruit is not present. No thyromegaly present.  Cardiovascular: Normal rate and regular rhythm.   Pulmonary/Chest: Effort normal and breath sounds normal.  Abdominal: Soft. Bowel sounds are normal. He exhibits no distension.  Musculoskeletal: He exhibits no edema.  Neurological: He displays no tremor. He displays no seizure activity. Coordination and gait normal.   No tics  Skin: Skin is warm and dry. No pallor.  Psychiatric: He has a normal mood and affect. His behavior is normal. Judgment and thought content normal.    Results for orders placed or performed in visit on 10/06/15  POCT urinalysis dipstick  Result Value Ref Range   Color, UA yellow    Clarity, UA clear    Glucose, UA neg    Bilirubin, UA neg    Ketones, UA neg    Spec Grav, UA 1.020    Blood, UA neg    pH, UA 5.5    Protein, UA neg    Urobilinogen, UA 0.2    Nitrite, UA neg    Leukocytes, UA Negative Negative      Assessment & Plan:   Problem List Items Addressed This Visit      Cardiovascular and Mediastinum   Syncopal episodes - Primary   Relevant Orders   ECHOCARDIOGRAM STRESS TEST   Holter monitor - 48 hour   Ambulatory referral to Neurology     Other   Snoring    Urged him to get back in with pulmologist      Anxiety    Encouraged patient to work on counseling, given the stress he is under with family issues       Other Visit Diagnoses    Protein in urine       Relevant Orders   POCT urinalysis dipstick (Completed)      Follow up plan: Return 2-3 weeks.  An after-visit summary was printed and given to the patient at check-out.  Please see the patient instructions which may contain other information and recommendations beyond what is mentioned above in the assessment and plan.  No orders of the defined types were placed in this encounter.   Orders Placed This Encounter  Procedures  . Ambulatory referral to Neurology  . Holter monitor - 48 hour  . POCT urinalysis dipstick  . ECHOCARDIOGRAM STRESS TEST

## 2015-10-06 NOTE — Assessment & Plan Note (Signed)
Encouraged patient to work on counseling, given the stress he is under with family issues

## 2015-10-06 NOTE — Patient Instructions (Addendum)
Please return the stool cards at your earliest convenience I recommend a colonoscopy; let me know when you're ready We'll get a stress echo and holter monitor and refer you to neurologist Be safe when you are in a car

## 2015-10-09 ENCOUNTER — Telehealth: Payer: Self-pay | Admitting: Family Medicine

## 2015-10-09 NOTE — Telephone Encounter (Signed)
[facility] is requesting a return call pertaining to Echo Stress [phone]

## 2015-10-26 ENCOUNTER — Ambulatory Visit: Payer: Self-pay | Admitting: Physician Assistant

## 2015-10-26 ENCOUNTER — Encounter: Payer: Self-pay | Admitting: Physician Assistant

## 2015-10-26 VITALS — BP 110/70 | HR 88 | Temp 97.7°F

## 2015-10-26 DIAGNOSIS — R059 Cough, unspecified: Secondary | ICD-10-CM

## 2015-10-26 DIAGNOSIS — R05 Cough: Secondary | ICD-10-CM

## 2015-10-26 MED ORDER — AMOXICILLIN 875 MG PO TABS: 875.0000 mg | ORAL_TABLET | Freq: Two times a day (BID) | ORAL | 0 refills | Status: DC

## 2015-10-26 NOTE — Progress Notes (Signed)
S: c/o cough for at least 2 weeks, mostly dry, no cp/sob, no fever/chills, using inhaler a little more, is also being worked up for a syncopal episode, cardiologist has ordered stress test and echo;   O: vitals wnl, nad, appears well, cough is dry, tms dull, nasal mucosa inflamed, throat cobblestoned, neck supple no lymph, lungs c t a, cv rrr  A: cough  P: amoxil, if not better in 10 days will order cxr, pt to f/u with cardiology, consider asthma vs cardiac etiology if cough continues

## 2015-10-27 ENCOUNTER — Ambulatory Visit: Payer: Managed Care, Other (non HMO) | Admitting: Family Medicine

## 2015-11-02 ENCOUNTER — Other Ambulatory Visit: Payer: Self-pay

## 2015-11-02 ENCOUNTER — Telehealth: Payer: Self-pay | Admitting: Family Medicine

## 2015-11-02 DIAGNOSIS — F419 Anxiety disorder, unspecified: Secondary | ICD-10-CM

## 2015-11-03 MED ORDER — LORAZEPAM 1 MG PO TABS: 1.0000 mg | ORAL_TABLET | Freq: Two times a day (BID) | ORAL | 0 refills | Status: DC | PRN

## 2015-11-03 NOTE — Telephone Encounter (Signed)
[place] website reviewed; no early fills, no other prescribers; will wean monthly amount somewhat; rx approved

## 2015-11-10 NOTE — Telephone Encounter (Signed)
Completed.

## 2015-12-06 NOTE — Assessment & Plan Note (Signed)
Urged him to get back in with pulmologist

## 2015-12-22 ENCOUNTER — Telehealth: Payer: Self-pay | Admitting: Family Medicine

## 2015-12-22 DIAGNOSIS — F419 Anxiety disorder, unspecified: Secondary | ICD-10-CM

## 2015-12-22 NOTE — Telephone Encounter (Signed)
NCCS[place] web site reviewed; will continue to wean Last appt was 10/06/15; he'll need an appt to get any medicine after today's refill please

## 2015-12-23 NOTE — Telephone Encounter (Signed)
Pt notified of refills and scheduled an appt

## 2016-01-04 ENCOUNTER — Encounter: Payer: Self-pay | Admitting: Family Medicine

## 2016-01-04 ENCOUNTER — Ambulatory Visit (INDEPENDENT_AMBULATORY_CARE_PROVIDER_SITE_OTHER): Payer: Managed Care, Other (non HMO) | Admitting: Family Medicine

## 2016-01-04 DIAGNOSIS — F4002 Agoraphobia without panic disorder: Secondary | ICD-10-CM

## 2016-01-04 DIAGNOSIS — R0683 Snoring: Secondary | ICD-10-CM | POA: Diagnosis not present

## 2016-01-04 DIAGNOSIS — E663 Overweight: Secondary | ICD-10-CM | POA: Diagnosis not present

## 2016-01-04 DIAGNOSIS — F419 Anxiety disorder, unspecified: Secondary | ICD-10-CM | POA: Diagnosis not present

## 2016-01-04 DIAGNOSIS — I10 Essential (primary) hypertension: Secondary | ICD-10-CM

## 2016-01-04 DIAGNOSIS — Z0289 Encounter for other administrative examinations: Secondary | ICD-10-CM

## 2016-01-04 DIAGNOSIS — Z79899 Other long term (current) drug therapy: Secondary | ICD-10-CM

## 2016-01-04 MED ORDER — SERTRALINE HCL 100 MG PO TABS: 150.0000 mg | ORAL_TABLET | Freq: Every day | ORAL | 2 refills | Status: DC

## 2016-01-04 NOTE — Patient Instructions (Addendum)
Your goal blood pressure is less than 140 mmHg on top. Try to follow the DASH guidelines (DASH stands for Dietary Approaches to Stop Hypertension) Try to limit the sodium in your diet.  Ideally, consume less than 1.5 grams (less than 1,500mg ) per day. Do not add salt when cooking or at the table.  Check the sodium amount on labels when shopping, and choose items lower in sodium when given a choice. Avoid or limit foods that already contain a lot of sodium. Eat a diet rich in fruits and vegetables and whole grains.  Increase the sertraline to 150 mg daily Return in 4 weeks, call sooner if needed

## 2016-01-04 NOTE — Progress Notes (Signed)
BP (!) 138/98 (BP Location: Right Arm, Patient Position: Sitting, Cuff Size: Normal)   Pulse 86   Temp 98.3 F (36.8 C) (Oral)   Resp 16   Ht 6\' 3"  (1.905 m)   Wt 210 lb 7 oz (95.5 kg)   SpO2 97%   BMI 26.30 kg/m    Subjective:    Patient ID: Richard LeatherwoodJames L Funes Jr., male    DOB: 1961-01-24, 55 y.o.   MRN: [id number]  HPI: Richard LeatherwoodJames L Heidrich Jr. is a 55 y.o. male  Chief Complaint  Patient presents with  . Follow-up    Medication Refill    Patient is here for follow-up Lots going on his life; divorce, stress at work Sleeping is fair Not over-eating; appetite is okay in spite of the stress No thoughts of SI/HI Using ativan about every day, some days he may not use it  Blood pressure is up today relative to his norm He had roast beef, mashed potatoes and gravy; frozen dinner yesterday  He has lost a little bit more weight; had gotten up to 240 pounds; he has cut down on fast food; eat until satisfied, cut back on this and that  He snores and then pauses and quits breathing, then gasps for air; he was told this by his girlfriend  [facility] 01/04/2016 10/06/2015 09/10/2015  Decreased Interest 0 0 0  Down, Depressed, Hopeless 0 0 0  PHQ - 2 Score 0 0 0    Relevant past medical, surgical, family and social history reviewed Past Medical History:  Diagnosis Date  . Agoraphobia without history of panic disorder   . Anxiety   . Depression   . Dizziness   . Elevated serum glutamic pyruvic transaminase (SGPT) level   . Fatty liver 09/10/2015  . High triglycerides 09/10/2015  . Hypertension   . IFG (impaired fasting glucose)   . Kidney stone   . Low HDL (under 40)   . Overweight (BMI 25.0-29.9) 11/06/2014  . Snoring 09/10/2015  . Vitamin D deficiency    Past Surgical History:  Procedure Laterality Date  . NASAL SINUS SURGERY  2008   Family History  Problem Relation Age of Onset  . Heart disease Father     heart attack age 53  . Heart attack Father   .  Hyperlipidemia Father   . Sleep apnea Father   . Cancer Brother     hodgins, liver cancer   Social History  Substance Use Topics  . Smoking status: Former Smoker    Packs/day: 1.00    Years: 18.00    Types: Cigarettes    Quit date: 03/28/1990  . Smokeless tobacco: Current User  . Alcohol use No     Comment: occasionally   Interim medical history since last visit reviewed. Allergies and medications reviewed  Review of Systems Per HPI unless specifically indicated above     Objective:    BP (!) 138/98 (BP Location: Right Arm, Patient Position: Sitting, Cuff Size: Normal)   Pulse 86   Temp 98.3 F (36.8 C) (Oral)   Resp 16   Ht 6\' 3"  (1.905 m)   Wt 210 lb 7 oz (95.5 kg)   SpO2 97%   BMI 26.30 kg/m   Wt Readings from Last 3 Encounters:  01/04/16 210 lb 7 oz (95.5 kg)  10/06/15 211 lb 9.6 oz (96 kg)  10/03/15 215 lb (97.5 kg)    Physical Exam  Constitutional: He appears well-developed and well-nourished. No distress.  HENT:  Head: Normocephalic and atraumatic.  Eyes: EOM are normal. No scleral icterus.  Neck: No JVD present. Carotid bruit is not present. No thyromegaly present.  Cardiovascular: Normal rate and regular rhythm.   Pulmonary/Chest: Effort normal and breath sounds normal.  Abdominal: Soft. Bowel sounds are normal. He exhibits no distension.  Musculoskeletal: He exhibits no edema.  Neurological: He displays no tremor. He displays no seizure activity. Coordination and gait normal.  No tics  Skin: Skin is warm and dry. No pallor.  Psychiatric: He has a normal mood and affect. His behavior is normal. Judgment and thought content normal.   Results for orders placed or performed in visit on 10/06/15  POCT urinalysis dipstick  Result Value Ref Range   Color, UA yellow    Clarity, UA clear    Glucose, UA neg    Bilirubin, UA neg    Ketones, UA neg    Spec Grav, UA 1.020    Blood, UA neg    pH, UA 5.5    Protein, UA neg    Urobilinogen, UA 0.2     Nitrite, UA neg    Leukocytes, UA Negative Negative      Assessment & Plan:   Problem List Items Addressed This Visit      Cardiovascular and Mediastinum   Hypertension    Not to goal; cut back on high sodium foods, try to follow the DASH guidelines; work on continued weight loss; work on stress; will increase sertraline to see if stress playing a significant role in BP; undiagnosed/untreated OSA may also be playing a factor; refer to pulm for possible OSA work-up        Other   Snoring    Refer for sleep evaluation      Relevant Orders   Ambulatory referral to Pulmonology   Overweight (BMI 25.0-29.9)    Continue to work on weight loss; praise given      Chronic anxiety    Stress in his life currently, so we'll increase SSRI to keep benzo use at a minimum; call if any problems, esp elevated mood      Relevant Medications   sertraline (ZOLOFT) 100 MG tablet   Benzodiazepine use agreement exists    Using benzo for stormy moments; work on stress; increase SSRI which may help keep benzo use limited      Agoraphobia without history of panic disorder    Increase SSRI and use benzo just for stormy moments       Other Visit Diagnoses   None.      Follow up plan: Return in about 4 weeks (around 02/01/2016).  An after-visit summary was printed and given to the patient at check-out.  Please see the patient instructions which may contain other information and recommendations beyond what is mentioned above in the assessment and plan.  Meds ordered this encounter  Medications  . sertraline (ZOLOFT) 100 MG tablet    Sig: Take 1.5 tablets (150 mg total) by mouth daily.    Dispense:  45 tablet    Refill:  2    We're increasing dose    Orders Placed This Encounter  Procedures  . Ambulatory referral to Pulmonology

## 2016-01-04 NOTE — Assessment & Plan Note (Signed)
Refer for sleep evaluation.

## 2016-01-09 NOTE — Assessment & Plan Note (Signed)
Not to goal; cut back on high sodium foods, try to follow the DASH guidelines; work on continued weight loss; work on stress; will increase sertraline to see if stress playing a significant role in BP; undiagnosed/untreated OSA may also be playing a factor; refer to pulm for possible OSA work-up

## 2016-01-09 NOTE — Assessment & Plan Note (Signed)
Increase SSRI and use benzo just for stormy moments

## 2016-01-09 NOTE — Assessment & Plan Note (Signed)
Stress in his life currently, so we'll increase SSRI to keep benzo use at a minimum; call if any problems, esp elevated mood

## 2016-01-09 NOTE — Assessment & Plan Note (Signed)
Continue to work on weight loss; praise given

## 2016-01-09 NOTE — Assessment & Plan Note (Signed)
Using benzo for stormy moments; work on stress; increase SSRI which may help keep benzo use limited

## 2016-02-01 ENCOUNTER — Institutional Professional Consult (permissible substitution): Payer: Self-pay | Admitting: Internal Medicine

## 2016-02-01 NOTE — Progress Notes (Signed)
[facility]      Assessment and Plan:  Excessive daytime somnolence. --Signs and symptoms of OSA, will send for sleep study.   Essential hypertension. --Sleep apnea can contribute to essential hypertension, therefore, it is important to treat the sleep apnea if present.    Date: 02/01/2016  MRN# [id number] Richard LeatherwoodJames L Barraco Jr. 1960-12-13  Referring Physician:   Elberta LeatherwoodJames L Hieronymus Jr. is a 55 y.o. old male seen in consultation for chief complaint of:    Chief Complaint  Patient presents with  . Advice Only    sleep issues: daytime sleepiness; spouse states pt snores, gasp for air, stops breathing in sleep    HPI:   The patient is a 55 year old male referred for witnessed apneas. He has a history of depression, hypertension, obesity, recent episode of syncope.  Pt notes that his wife tells him that he snores and stops breathing in his sleep. He goes to bed at 8 pm; wake at 4 am. He works for [employer]. On mornings that he does not have to wake he will wake after noon. When he wakes at 4 am he feels tired.  He feels sluggish and sleepy during the day, he has trouble concentrating. He has had syncopal episodes but no narcolepsy.      PMHX:   Past Medical History:  Diagnosis Date  . Agoraphobia without history of panic disorder   . Anxiety   . Depression   . Dizziness   . Elevated serum glutamic pyruvic transaminase (SGPT) level   . Fatty liver 09/10/2015  . High triglycerides 09/10/2015  . Hypertension   . IFG (impaired fasting glucose)   . Kidney stone   . Low HDL (under 40)   . Overweight (BMI 25.0-29.9) 11/06/2014  . Snoring 09/10/2015  . Vitamin D deficiency    Surgical Hx:  Past Surgical History:  Procedure Laterality Date  . NASAL SINUS SURGERY  2008   Family Hx:  Family History  Problem Relation Age of Onset  . Heart disease Father     heart attack age 53  . Heart attack Father   . Hyperlipidemia Father   . Sleep apnea Father    . Cancer Brother     hodgins, liver cancer   Social Hx:   Social History  Substance Use Topics  . Smoking status: Former Smoker    Packs/day: 1.00    Years: 18.00    Types: Cigarettes    Quit date: 03/28/1990  . Smokeless tobacco: Current User  . Alcohol use No     Comment: occasionally   Medication:   Reviewed.    Allergies:  Patient has no known allergies.  Review of Systems: Gen:  Denies  fever, sweats, chills HEENT: Denies blurred vision, double vision. bleeds, sore throat Cvc:  No dizziness, chest pain. Resp:   Denies cough or sputum production, shortness of breath Gi: Denies swallowing difficulty, stomach pain. Gu:  Denies bladder incontinence, burning urine Ext:   No Joint pain, stiffness. Skin: No skin rash,  hives  Endoc:  No polyuria, polydipsia. Psych: No depression, insomnia. Other:  All other systems were reviewed with the patient and were negative other that what is mentioned in the HPI.   Physical Examination:   VS: BP 138/72 (BP Location: Left Arm, Cuff Size: Normal)   Pulse 77   Ht 6\' 3"  (1.905 m)   Wt 212 lb (96.2 kg)   SpO2 95%   BMI 26.50 kg/m   General  Appearance: No distress  Neuro:without focal findings,  speech normal,  HEENT: PERRLA, EOM intact.  Mallampati 3. Pulmonary: normal breath sounds, No wheezing.  CardiovascularNormal S1,S2.  No m/r/g.   Abdomen: Benign, Soft, non-tender. Renal:  No costovertebral tenderness  GU:  No performed at this time. Endoc: No evident thyromegaly, no signs of acromegaly. Skin:   warm, no rashes, no ecchymosis  Extremities: normal, no cyanosis, clubbing.  Other findings:    LABORATORY PANEL:   CBC No results for input(s): WBC, HGB, HCT, PLT in the last 168 hours. ------------------------------------------------------------------------------------------------------------------  Chemistries  No results for input(s): NA, K, CL, CO2, GLUCOSE, BUN, CREATININE, CALCIUM, MG, AST, ALT, ALKPHOS, BILITOT  in the last 168 hours.  Invalid input(s): GFRCGP ------------------------------------------------------------------------------------------------------------------  Cardiac Enzymes No results for input(s): TROPONINI in the last 168 hours. ------------------------------------------------------------  RADIOLOGY:  No results found.     Thank  you for the consultation and for allowing [facility] to assist in the care of your patient. Our recommendations are noted above.  Please contact us if we can be of further service.   Wells Guileseep Theophile Harvie, MD.  Board Certified in Internal Medicine, Pulmonary Medicine, Critical Care Medicine, and Sleep Medicine.  [facility] Office Number: [phone]  Santiago Gladavid Kasa, M.D.  Stephanie AcreVishal Mungal, M.D.  Billy Fischeravid Simonds, M.D  02/01/2016

## 2016-02-02 ENCOUNTER — Encounter: Payer: Self-pay | Admitting: Internal Medicine

## 2016-02-02 ENCOUNTER — Ambulatory Visit (INDEPENDENT_AMBULATORY_CARE_PROVIDER_SITE_OTHER): Payer: Managed Care, Other (non HMO) | Admitting: Internal Medicine

## 2016-02-02 ENCOUNTER — Encounter: Payer: Self-pay | Admitting: Family Medicine

## 2016-02-02 ENCOUNTER — Ambulatory Visit (INDEPENDENT_AMBULATORY_CARE_PROVIDER_SITE_OTHER): Payer: Managed Care, Other (non HMO) | Admitting: Family Medicine

## 2016-02-02 VITALS — BP 138/72 | HR 77 | Ht 75.0 in | Wt 212.0 lb

## 2016-02-02 DIAGNOSIS — I10 Essential (primary) hypertension: Secondary | ICD-10-CM

## 2016-02-02 DIAGNOSIS — F419 Anxiety disorder, unspecified: Secondary | ICD-10-CM | POA: Diagnosis not present

## 2016-02-02 DIAGNOSIS — G4719 Other hypersomnia: Secondary | ICD-10-CM

## 2016-02-02 DIAGNOSIS — R0683 Snoring: Secondary | ICD-10-CM

## 2016-02-02 MED ORDER — LORAZEPAM 1 MG PO TABS: 0.5000 mg | ORAL_TABLET | Freq: Two times a day (BID) | ORAL | 2 refills | Status: DC | PRN

## 2016-02-02 NOTE — Patient Instructions (Signed)
Please stay away from black licorice Okay to eat red licorice

## 2016-02-02 NOTE — Assessment & Plan Note (Signed)
May continue the benzo; use sparingly, only when needed; never mix with alcohol; return in 3 months for next f/u

## 2016-02-02 NOTE — Progress Notes (Signed)
BP 118/60   Pulse 77   Temp 98.4 F (36.9 C) (Oral)   Resp 14   Wt 214 lb (97.1 kg)   SpO2 97%   BMI 26.75 kg/m    Subjective:    Patient ID: Richard LeatherwoodJames L Divis Jr., male    DOB: 1961/01/26, 55 y.o.   MRN: [id number]  HPI: Richard LeatherwoodJames L Buda Jr. is a 55 y.o. male  Chief Complaint  Patient presents with  . Follow-up   Patient is here for f/u of HTN; last BP was 138/98 He has not checked BP away from office Since last visit, doing a better job of staying away from salt; not much fast food No decongestants Does eat black licorice, but not often  He reported snoring and was referred to Dr. Nicholos Johnsamachandran; he just saw him today; they will set him up for a sleep study; sometimes wakes himself up  The sertraline was increased at last visit, no mania; no hypomania; no SI or HI  Chronic anxiety; using lorazepam, 30 pills lasting more than 30 days; no bad side effects; no alcohol with this   Depression screen [facility] 2/[facility] 02/02/2016 01/04/2016 10/06/2015 09/10/2015  Decreased Interest 0 0 0 0  Down, Depressed, Hopeless 0 0 0 0  PHQ - 2 Score 0 0 0 0   Relevant past medical, surgical, family and social history reviewed Past Medical History:  Diagnosis Date  . Agoraphobia without history of panic disorder   . Anxiety   . Depression   . Dizziness   . Elevated serum glutamic pyruvic transaminase (SGPT) level   . Fatty liver 09/10/2015  . High triglycerides 09/10/2015  . Hypertension   . IFG (impaired fasting glucose)   . Kidney stone   . Low HDL (under 40)   . Overweight (BMI 25.0-29.9) 11/06/2014  . Snoring 09/10/2015  . Vitamin D deficiency    Family History  Problem Relation Age of Onset  . Heart disease Father     heart attack age 53  . Heart attack Father   . Hyperlipidemia Father   . Sleep apnea Father   . Cancer Brother     hodgins, liver cancer  MD note: he has two brothers who were preachers; one brother died a few weeks ago from Lymphoma, age 53  Social History  Substance Use  Topics  . Smoking status: Former Smoker    Packs/day: 1.00    Years: 18.00    Types: Cigarettes    Quit date: 03/28/1990  . Smokeless tobacco: Current User    Types: Snuff  . Alcohol use No     Comment: occasionally   Interim medical history since last visit reviewed. Allergies and medications reviewed  Review of Systems Per HPI unless specifically indicated above     Objective:    BP 118/60   Pulse 77   Temp 98.4 F (36.9 C) (Oral)   Resp 14   Wt 214 lb (97.1 kg)   SpO2 97%   BMI 26.75 kg/m   Wt Readings from Last 3 Encounters:  02/02/16 214 lb (97.1 kg)  02/02/16 212 lb (96.2 kg)  01/04/16 210 lb 7 oz (95.5 kg)    Physical Exam  Constitutional: He appears well-developed and well-nourished. No distress.  HENT:  Head: Normocephalic and atraumatic.  Eyes: EOM are normal. No scleral icterus.  Neck: No JVD present. Carotid bruit is not present. No thyromegaly present.  Cardiovascular: Normal rate and regular rhythm.   Pulmonary/Chest: Effort normal and breath  sounds normal.  Abdominal: Soft. Bowel sounds are normal. He exhibits no distension.  Musculoskeletal: He exhibits no edema.  Neurological: He displays no tremor. He displays no seizure activity. Coordination and gait normal.  No tics  Skin: Skin is warm and dry. No pallor.  Psychiatric: He has a normal mood and affect. His behavior is normal. Judgment and thought content normal.    Results for orders placed or performed in visit on 10/06/15  POCT urinalysis dipstick  Result Value Ref Range   Color, UA yellow    Clarity, UA clear    Glucose, UA neg    Bilirubin, UA neg    Ketones, UA neg    Spec Grav, UA 1.020    Blood, UA neg    pH, UA 5.5    Protein, UA neg    Urobilinogen, UA 0.2    Nitrite, UA neg    Leukocytes, UA Negative Negative      Assessment & Plan:   Problem List Items Addressed This Visit      Cardiovascular and Mediastinum   Hypertension    Much better control with higher dose of  SSRI; continue same, limit salt, follow DASH guidelines        Other   Snoring    Appreciate pulmonology consult, sleep study pending      Chronic anxiety    May continue the benzo; use sparingly, only when needed; never mix with alcohol; return in 3 months for next f/u      Relevant Medications   LORazepam (ATIVAN) 1 MG tablet      Follow up plan: Return in about 3 months (around 05/04/2016) for medication management.  An after-visit summary was printed and given to the patient at check-out.  Please see the patient instructions which may contain other information and recommendations beyond what is mentioned above in the assessment and plan.  Meds ordered this encounter  Medications  . LORazepam (ATIVAN) 1 MG tablet    Sig: Take 0.5-1 tablets (0.5-1 mg total) by mouth every 12 (twelve) hours as needed for anxiety.    Dispense:  30 tablet    Refill:  2    30 pills to last at least 30 days; must have appt for further refills

## 2016-02-02 NOTE — Assessment & Plan Note (Signed)
Much better control with higher dose of SSRI; continue same, limit salt, follow DASH guidelines

## 2016-02-02 NOTE — Assessment & Plan Note (Signed)
Appreciate pulmonology consult, sleep study pending

## 2016-02-02 NOTE — Addendum Note (Signed)
Addended by: Meyer CoryAHMAD, MISTY R on: 02/02/2016 10:59 AM   Modules accepted: Orders

## 2016-02-02 NOTE — Patient Instructions (Signed)
  Sleep Apnea Sleep apnea is disorder that affects a person's sleep. A person with sleep apnea has abnormal pauses in their breathing when they sleep. It is hard for them to get a good sleep. This makes a person tired during the day. It also can lead to other physical problems. There are three types of sleep apnea. One type is when breathing stops for a short time because your airway is blocked (obstructive sleep apnea). Another type is when the brain sometimes fails to give the normal signal to breathe to the muscles that control your breathing (central sleep apnea). The third type is a combination of the other two types. HOME CARE   Take all medicine as told by your doctor.  Avoid alcohol, calming medicines (sedatives), and depressant drugs.  Try to lose weight if you are overweight. Talk to your doctor about a healthy weight goal.  Your doctor may have you use a device that helps to open your airway. It can help you get the air that you need. It is called a positive airway pressure (PAP) device.   MAKE SURE YOU:   Understand these instructions.  Will watch your condition.  Will get help right away if you are not doing well or get worse.  It may take approximately 1 month for you to get used to wearing her CPAP every night.  Be sure to work with your machine to get used to it, be patient, it may take time! 

## 2016-02-11 ENCOUNTER — Encounter: Payer: Self-pay | Admitting: Internal Medicine

## 2016-02-11 DIAGNOSIS — G4719 Other hypersomnia: Secondary | ICD-10-CM

## 2016-02-12 DIAGNOSIS — G4733 Obstructive sleep apnea (adult) (pediatric): Secondary | ICD-10-CM

## 2016-02-16 ENCOUNTER — Encounter: Payer: Self-pay | Admitting: Physician Assistant

## 2016-02-16 ENCOUNTER — Ambulatory Visit: Payer: Self-pay | Admitting: Physician Assistant

## 2016-02-16 VITALS — BP 120/71 | HR 91 | Temp 98.6°F

## 2016-02-16 DIAGNOSIS — J0100 Acute maxillary sinusitis, unspecified: Secondary | ICD-10-CM

## 2016-02-16 MED ORDER — AZITHROMYCIN 250 MG PO TABS: ORAL_TABLET | ORAL | 0 refills | Status: DC

## 2016-02-16 NOTE — Progress Notes (Signed)
S: C/o runny nose and congestion for 3 days, no fever, chills, cp/sob, v/d; mucus is green and thick, cough is sporadic, c/o of facial and dental pain.   Using otc meds:   O: PE: vitals wnl, nad, perrl eomi, normocephalic, tms dull, nasal mucosa red and swollen, throat injected, neck supple no lymph, lungs c t a, cv rrr, neuro intact  A:  Acute sinusitis   P: drink fluids, continue regular meds , use otc meds of choice, return if not improving in 5 days, return earlier if worsening , zpack   

## 2016-02-17 ENCOUNTER — Telehealth: Payer: Self-pay | Admitting: *Deleted

## 2016-02-17 DIAGNOSIS — G4733 Obstructive sleep apnea (adult) (pediatric): Secondary | ICD-10-CM

## 2016-02-17 NOTE — Telephone Encounter (Signed)
Pt informed of sleep study results and the need for a CPAP machine.  Due to insurance not allowing CPAP titration in lab will order auto CPAP 5-20 auto. Order placed. Nothing further needed.

## 2016-02-25 ENCOUNTER — Telehealth: Payer: Self-pay | Admitting: Emergency Medicine

## 2016-02-25 MED ORDER — BENZONATATE 200 MG PO CAPS: 200.0000 mg | ORAL_CAPSULE | Freq: Three times a day (TID) | ORAL | 0 refills | Status: DC | PRN

## 2016-02-25 NOTE — Telephone Encounter (Signed)
Patient called and expressed that he has finished his antibiotics but has a lingering cough that won't go away.  Wants to know if we can call in [facility]

## 2016-02-25 NOTE — Telephone Encounter (Signed)
Pt called said he needs something for the cough, took last pill on zpack but cough is lingering

## 2016-03-16 ENCOUNTER — Other Ambulatory Visit: Payer: Self-pay | Admitting: Family Medicine

## 2016-03-17 ENCOUNTER — Other Ambulatory Visit: Payer: Self-pay

## 2016-03-17 MED ORDER — AZELASTINE HCL 0.1 % NA SOLN: 2.0000 | Freq: Two times a day (BID) | NASAL | 11 refills | Status: DC

## 2016-03-17 NOTE — Telephone Encounter (Signed)
rx approved

## 2016-03-23 ENCOUNTER — Encounter: Payer: Self-pay | Admitting: Internal Medicine

## 2016-05-04 ENCOUNTER — Encounter: Payer: Self-pay | Admitting: Family Medicine

## 2016-05-04 ENCOUNTER — Ambulatory Visit (INDEPENDENT_AMBULATORY_CARE_PROVIDER_SITE_OTHER): Payer: Managed Care, Other (non HMO) | Admitting: Family Medicine

## 2016-05-04 DIAGNOSIS — R7301 Impaired fasting glucose: Secondary | ICD-10-CM

## 2016-05-04 DIAGNOSIS — Z79899 Other long term (current) drug therapy: Secondary | ICD-10-CM

## 2016-05-04 DIAGNOSIS — F4002 Agoraphobia without panic disorder: Secondary | ICD-10-CM

## 2016-05-04 DIAGNOSIS — Z1211 Encounter for screening for malignant neoplasm of colon: Secondary | ICD-10-CM | POA: Diagnosis not present

## 2016-05-04 DIAGNOSIS — K760 Fatty (change of) liver, not elsewhere classified: Secondary | ICD-10-CM | POA: Diagnosis not present

## 2016-05-04 DIAGNOSIS — Z5181 Encounter for therapeutic drug level monitoring: Secondary | ICD-10-CM

## 2016-05-04 DIAGNOSIS — I10 Essential (primary) hypertension: Secondary | ICD-10-CM

## 2016-05-04 DIAGNOSIS — E781 Pure hyperglyceridemia: Secondary | ICD-10-CM

## 2016-05-04 NOTE — Assessment & Plan Note (Signed)
Well controlled today.

## 2016-05-04 NOTE — Assessment & Plan Note (Signed)
Check labs 

## 2016-05-04 NOTE — Assessment & Plan Note (Signed)
Check lipids today; less fried foods and less starchy foods

## 2016-05-04 NOTE — Assessment & Plan Note (Signed)
Reviewed NCCSRS web site and no red flags

## 2016-05-04 NOTE — Assessment & Plan Note (Signed)
Check cholesterol and liver enzymes; limit saturated fats

## 2016-05-04 NOTE — Patient Instructions (Signed)
Let's get labs today Tetanus booster today will be good for up to 10 years Call next week when refill needed Return in 3 months Try to limit saturated fats in your diet (bologna, hot dogs, barbeque, cheeseburgers, hamburgers, steak, bacon, sausage, cheese, etc.) and get more fresh fruits, vegetables, and whole grains

## 2016-05-04 NOTE — Assessment & Plan Note (Signed)
Doing well on current medicine regimen; continue same; return in 3 months

## 2016-05-04 NOTE — Progress Notes (Signed)
BP 118/70   Pulse 79   Temp 97.8 F (36.6 C) (Oral)   Resp 14   Ht 6\' 3"  (1.905 m)   Wt 219 lb 7 oz (99.5 kg)   SpO2 95%   BMI 27.43 kg/m    Subjective:    Patient ID: Richard LeatherwoodJames L Wardell Jr., male    DOB: 06-Aug-1960, 56 y.o.   MRN: [id number]  HPI: Richard LeatherwoodJames L Dantes Jr. is a 56 y.o. male  Chief Complaint  Patient presents with  . Follow-up    Medication mangement and wellness screening form    Patient has a wellness screening form that needs completing Tetanus booster running out in April Ate around 1 am earlier this morning (he works nights) Hep C and HIV UTD Due for colonoscopy  Anxiety controlled with medicine; no panic attacks; medicine does not make him feel loopy or goofy or drunk; he drinks occasionally, but knows to not take the medicine within six hours of any alcohol; he is well aware of the risk of overdose He has about 20 pills left He will call when refill due, okay with me for 3 month supply  Depression screen [facility] 05/04/2016 02/02/2016 01/04/2016 10/06/2015 09/10/2015  Decreased Interest 0 0 0 0 0  Down, Depressed, Hopeless 0 0 0 0 0  PHQ - 2 Score 0 0 0 0 0   Relevant past medical, surgical, family and social history reviewed Past Medical History:  Diagnosis Date  . Agoraphobia without history of panic disorder   . Anxiety   . Depression   . Dizziness   . Elevated serum glutamic pyruvic transaminase (SGPT) level   . Fatty liver 09/10/2015  . High triglycerides 09/10/2015  . Hypertension   . IFG (impaired fasting glucose)   . Kidney stone   . Low HDL (under 40)   . Overweight (BMI 25.0-29.9) 11/06/2014  . Snoring 09/10/2015  . Vitamin D deficiency    Past Surgical History:  Procedure Laterality Date  . NASAL SINUS SURGERY  2008   Family History  Problem Relation Age of Onset  . Heart disease Father     heart attack age 174  . Heart attack Father   . Hyperlipidemia Father   . Sleep apnea Father   . Cancer Brother     hodgins, liver cancer  . Cancer  Brother    Social History  Substance Use Topics  . Smoking status: Former Smoker    Packs/day: 1.00    Years: 18.00    Types: Cigarettes    Quit date: 03/28/1990  . Smokeless tobacco: Current User    Types: Snuff  . Alcohol use No     Comment: occasionally   Interim medical history since last visit reviewed. Allergies and medications reviewed  Review of Systems Per HPI unless specifically indicated above     Objective:    BP 118/70   Pulse 79   Temp 97.8 F (36.6 C) (Oral)   Resp 14   Ht 6\' 3"  (1.905 m)   Wt 219 lb 7 oz (99.5 kg)   SpO2 95%   BMI 27.43 kg/m   Wt Readings from Last 3 Encounters:  05/04/16 219 lb 7 oz (99.5 kg)  02/02/16 214 lb (97.1 kg)  02/02/16 212 lb (96.2 kg)    Physical Exam  Constitutional: He appears well-developed and well-nourished. No distress.  Eyes: No scleral icterus.  Cardiovascular: Normal rate.   Pulmonary/Chest: Effort normal.  Neurological: He is alert.  Skin: No  pallor.  Psychiatric: He has a normal mood and affect.  Quiet, soft-spoken; not overly anxious in appearance; good eye contact with examiner   Results for orders placed or performed in visit on 10/06/15  POCT urinalysis dipstick  Result Value Ref Range   Color, UA yellow    Clarity, UA clear    Glucose, UA neg    Bilirubin, UA neg    Ketones, UA neg    Spec Grav, UA 1.020    Blood, UA neg    pH, UA 5.5    Protein, UA neg    Urobilinogen, UA 0.2    Nitrite, UA neg    Leukocytes, UA Negative Negative      Assessment & Plan:   Problem List Items Addressed This Visit      Cardiovascular and Mediastinum   Hypertension    Well-controlled today        Digestive   Fatty liver    Check cholesterol and liver enzymes; limit saturated fats      Relevant Orders   Lipid panel   COMPLETE METABOLIC PANEL WITH GFR     Endocrine   IFG (impaired fasting glucose)    Check glucose and A1c      Relevant Orders   Hemoglobin A1c     Other   Medication  monitoring encounter    Check labs      Relevant Orders   COMPLETE METABOLIC PANEL WITH GFR   High triglycerides    Check lipids today; less fried foods and less starchy foods      Relevant Orders   Lipid panel   Controlled substance agreement signed    Reviewed NCCSRS web site and no red flags      Colon cancer screening    cologuard ordered again today; pt would prefer that over colonoscopy         Follow up plan: No Follow-up on file.  An after-visit summary was printed and given to the patient at check-out.  Please see the patient instructions which may contain other information and recommendations beyond what is mentioned above in the assessment and plan.  No orders of the defined types were placed in this encounter.   Orders Placed This Encounter  Procedures  . Lipid panel  . Hemoglobin A1c  . COMPLETE METABOLIC PANEL WITH GFR

## 2016-05-04 NOTE — Assessment & Plan Note (Signed)
cologuard ordered again today; pt would prefer that over colonoscopy

## 2016-05-04 NOTE — Assessment & Plan Note (Signed)
Check glucose and A1c 

## 2016-05-06 LAB — COMPLETE METABOLIC PANEL WITH GFR
ALT: 29 U/L (ref 9–46)
AST: 34 U/L (ref 10–35)
Albumin: 4.4 g/dL (ref 3.6–5.1)
Alkaline Phosphatase: 67 U/L (ref 40–115)
BUN: 15 mg/dL (ref 7–25)
CALCIUM: 9.9 mg/dL (ref 8.6–10.3)
CHLORIDE: 101 mmol/L (ref 98–110)
CO2: 29 mmol/L (ref 20–31)
CREATININE: 1.08 mg/dL (ref 0.70–1.33)
GFR, EST AFRICAN AMERICAN: 89 mL/min (ref >=60)
GFR, Est Non African American: 77 mL/min (ref >=60)
Glucose, Bld: 91 mg/dL (ref 65–99)
Potassium: 4 mmol/L (ref 3.5–5.3)
Sodium: 140 mmol/L (ref 135–146)
Total Bilirubin: 0.5 mg/dL (ref 0.2–1.2)
Total Protein: 7.6 g/dL (ref 6.1–8.1)

## 2016-05-06 LAB — LIPID PANEL
Cholesterol: 184 mg/dL (ref <200)
HDL: 45 mg/dL (ref >40)
LDL Cholesterol: 113 mg/dL — ABNORMAL HIGH (ref <100)
Total CHOL/HDL Ratio: 4.1 Ratio (ref <5.0)
Triglycerides: 128 mg/dL (ref <150)
VLDL: 26 mg/dL (ref <30)

## 2016-05-06 LAB — HEMOGLOBIN A1C
HEMOGLOBIN A1C: 5.3 % (ref <5.7)
Mean Plasma Glucose: 105 mg/dL

## 2016-05-20 ENCOUNTER — Other Ambulatory Visit: Payer: Self-pay

## 2016-05-20 MED ORDER — SERTRALINE HCL 100 MG PO TABS: 150.0000 mg | ORAL_TABLET | Freq: Every day | ORAL | 11 refills | Status: DC

## 2016-05-20 NOTE — Telephone Encounter (Signed)
rx approved

## 2016-05-22 ENCOUNTER — Encounter: Payer: Self-pay | Admitting: Internal Medicine

## 2016-05-24 ENCOUNTER — Encounter: Payer: Self-pay | Admitting: Physician Assistant

## 2016-05-24 ENCOUNTER — Ambulatory Visit: Payer: Self-pay | Admitting: Physician Assistant

## 2016-05-24 VITALS — BP 100/60 | HR 73 | Temp 97.7°F

## 2016-05-24 DIAGNOSIS — A084 Viral intestinal infection, unspecified: Secondary | ICD-10-CM

## 2016-05-24 NOTE — Progress Notes (Signed)
S:  Pt c/o diarrhea, sx for 6 days, no fever/chills, no abd pain except for cramping with diarrhea; denies cp/sob, denies camping, bad food, recent antibiotics, or exposure to bad water, no blood in stool, did get darker stools after taking kaopectate; diarrhea has slowed down, only 2 episodes today Remainder ros neg  O:  Vitals wnl, nad, ENT wnl, neck supple no lymph, lungs c t a, cv rrr, abd soft nontender bs normal all 4 quads, neuro intact  A:  Viral diarreha  P:  Reassurance, fluids, brat diet, immodium ad for diarrhea if needed, return if not better in 3 days, return earlier if worsening

## 2016-05-30 ENCOUNTER — Other Ambulatory Visit: Payer: Self-pay | Admitting: Family Medicine

## 2016-05-30 DIAGNOSIS — F419 Anxiety disorder, unspecified: Secondary | ICD-10-CM

## 2016-05-31 NOTE — Telephone Encounter (Signed)
Last fill was 04/25/16 for 30 pills; NCCSRS web site reviewed; no red flags, no other prescribers Rx approved

## 2016-07-14 ENCOUNTER — Other Ambulatory Visit: Payer: Self-pay | Admitting: Physician Assistant

## 2016-08-02 ENCOUNTER — Ambulatory Visit (INDEPENDENT_AMBULATORY_CARE_PROVIDER_SITE_OTHER): Payer: Managed Care, Other (non HMO) | Admitting: Family Medicine

## 2016-08-02 ENCOUNTER — Encounter: Payer: Self-pay | Admitting: Family Medicine

## 2016-08-02 VITALS — BP 118/64 | HR 93 | Temp 98.3°F | Resp 16 | Wt 217.8 lb

## 2016-08-02 DIAGNOSIS — Z1211 Encounter for screening for malignant neoplasm of colon: Secondary | ICD-10-CM

## 2016-08-02 DIAGNOSIS — Z0289 Encounter for other administrative examinations: Secondary | ICD-10-CM

## 2016-08-02 DIAGNOSIS — K760 Fatty (change of) liver, not elsewhere classified: Secondary | ICD-10-CM | POA: Diagnosis not present

## 2016-08-02 DIAGNOSIS — Z79899 Other long term (current) drug therapy: Secondary | ICD-10-CM

## 2016-08-02 DIAGNOSIS — R7301 Impaired fasting glucose: Secondary | ICD-10-CM | POA: Diagnosis not present

## 2016-08-02 DIAGNOSIS — E663 Overweight: Secondary | ICD-10-CM

## 2016-08-02 DIAGNOSIS — I10 Essential (primary) hypertension: Secondary | ICD-10-CM | POA: Diagnosis not present

## 2016-08-02 DIAGNOSIS — F419 Anxiety disorder, unspecified: Secondary | ICD-10-CM

## 2016-08-02 MED ORDER — FEXOFENADINE HCL 180 MG PO TABS: 180.0000 mg | ORAL_TABLET | Freq: Every day | ORAL | 12 refills | Status: DC | PRN

## 2016-08-02 MED ORDER — LORAZEPAM 0.5 MG PO TABS: 0.2500 mg | ORAL_TABLET | Freq: Two times a day (BID) | ORAL | 1 refills | Status: DC | PRN

## 2016-08-02 NOTE — Assessment & Plan Note (Signed)
Patient agrees to taper his benzo further; continue the sertraline; will decrease the actual dose of the lorazepam pill; see AVS for other strategies for dealing with anxiety

## 2016-08-02 NOTE — Assessment & Plan Note (Signed)
Working on [employer]healthy eating, weight management; last SGPT normal

## 2016-08-02 NOTE — Assessment & Plan Note (Signed)
Patient is aware of risk of death if combining alcohol and benzo

## 2016-08-02 NOTE — Assessment & Plan Note (Signed)
Patient agrees to cologuard testing

## 2016-08-02 NOTE — Patient Instructions (Signed)
Let's wean down the lorazepam 12 Ways to Curb Anxiety  ?Anxiety is normal human sensation. It is what helped our ancestors survive the pitfalls of the wilderness. Anxiety is defined as experiencing worry or nervousness about an imminent event or something with an uncertain outcome. It is a feeling experienced by most people at some point in their lives. Anxiety can be triggered by a very personal issue, such as the illness of a loved one, or an event of global proportions, such as a refugee crisis. Some of the symptoms of anxiety are:  Feeling restless.  Having a feeling of impending danger.  Increased heart rate.  Rapid breathing. Sweating.  Shaking.  Weakness or feeling tired.  Difficulty concentrating on anything except the current worry.  Insomnia.  Stomach or bowel problems. What can we do about anxiety we may be feeling? There are many techniques to help manage stress and relax. Here are 12 ways you can reduce your anxiety almost immediately: 1. Turn off the constant feed of information. Take a social media sabbatical. Studies have shown that social media directly contributes to social anxiety.  2. Monitor your television viewing habits. Are you watching shows that are also contributing to your anxiety, such as 24-hour news stations? Try watching something else, or better yet, nothing at all. Instead, listen to music, read an inspirational book or practice a hobby. 3. Eat nutritious meals. Also, don't skip meals and keep healthful snacks on hand. Hunger and poor diet contributes to feeling anxious. 4. Sleep. Sleeping on a regular schedule for at least seven to eight hours a night will do wonders for your outlook when you are awake. 5. Exercise. Regular exercise will help rid your body of that anxious energy and help you get more restful sleep. 6. Try deep (diaphragmatic) breathing. Inhale slowly through your nose for five seconds and exhale through your mouth. 7. Practice acceptance and  gratitude. When anxiety hits, accept that there are things out of your control that shouldn't be of immediate concern.  8. Seek out humor. When anxiety strikes, watch a funny video, read jokes or call a friend who makes you laugh. Laughter is healing for our bodies and releases endorphins that are calming. 9. Stay positive. Take the effort to replace negative thoughts with positive ones. Try to see a stressful situation in a positive light. Try to come up with solutions rather than dwelling on the problem. 10. Figure out what triggers your anxiety. Keep a journal and make note of anxious moments and the events surrounding them. This will help you identify triggers you can avoid or even eliminate. 11. Talk to someone. Let a trusted friend, family member or even trained professional know that you are feeling overwhelmed and anxious. Verbalize what you are feeling and why.  12. Volunteer. If your anxiety is triggered by a crisis on a large scale, become an advocate and work to resolve the problem that is causing you unease. Anxiety is often unwelcome and can become overwhelming. If not kept in check, it can become a disorder that could require medical treatment. However, if you take the time to care for yourself and avoid the triggers that make you anxious, you will be able to find moments of relaxation and clarity that make your life much more enjoyable.  Return in August for fasting labs

## 2016-08-02 NOTE — Assessment & Plan Note (Signed)
Excellent control.   

## 2016-08-02 NOTE — Assessment & Plan Note (Signed)
Last A1c normal range; continue healthy eating, activity, weight management

## 2016-08-02 NOTE — Assessment & Plan Note (Signed)
Patient working on this

## 2016-08-02 NOTE — Progress Notes (Signed)
BP 118/64   Pulse 93   Temp 98.3 F (36.8 C) (Oral)   Resp 16   Wt 217 lb 12.8 oz (98.8 kg)   SpO2 96%   BMI 27.22 kg/m    Subjective:    Patient ID: Richard [name]., male    DOB: Oct 07, 1960, 56 y.o.   MRN: [id number]  HPI: Richard [name] is a 56 y.o. male  Chief Complaint  Patient presents with  . Follow-up   HPI Patient here for follow-up No medical excitement since last visit  Anxiety; doing good he says; feels anxious a few times a week on the medicine; triggers might include going to talk to manager at work; does well at home; gets enough sleep; good diet; using lorazepam now almost every day, takes it before work; taking a whole pill before work; does not make him loopy or groggy or tired; willing to decrease dose; he is using sertraline; he knows to not combine his benzo with any alcohol, aware of death possibility of mixing alcohol and benzo  Allergies; needs refill of the allegra, working well; allergic to grass and pollen; breaks out if rolling in the grass; using nasal spray and that works well; not having to use rescue inhaler at all  Reviewed his last labs together; liver enzyme returned to normal; he is working on weight loss and has lost another few pounds; he had prediabetes, and with weight loss and healthier eating has brought his A1c back to normal too  Depression screen [facility] 2/9 08/02/2016 05/04/2016 02/02/2016 01/04/2016 10/06/2015  Decreased Interest 0 0 0 0 0  Down, Depressed, Hopeless 0 0 0 0 0  PHQ - 2 Score 0 0 0 0 0   Relevant past medical, surgical, family and social history reviewed Past Medical History:  Diagnosis Date  . Agoraphobia without history of panic disorder   . Anxiety   . Depression   . Dizziness   . Elevated serum glutamic pyruvic transaminase (SGPT) level   . Fatty liver 09/10/2015  . High triglycerides 09/10/2015  . Hypertension   . IFG (impaired fasting glucose)   . Kidney stone   . Low HDL (under 40)   . Overweight (BMI  25.0-29.9) 11/06/2014  . Snoring 09/10/2015  . Vitamin D deficiency    Past Surgical History:  Procedure Laterality Date  . NASAL SINUS SURGERY  2008   family history includes Cancer in his brother and brother; Heart attack in his father; Heart disease in his father; Hyperlipidemia in his father; Sleep apnea in his father.   Social History   Social History  . Marital status: Married    Spouse name: Richard [name]  . Number of children: Richard [name]  . Years of education: Richard [name]   Occupational History  . Not on file.   Social History Main Topics  . Smoking status: Former Smoker    Packs/day: 1.00    Years: 18.00    Types: Cigarettes    Quit date: 03/28/1990  . Smokeless tobacco: Current User    Types: Snuff  . Alcohol use No     Comment: occasionally  . Drug use: No  . Sexual activity: Not on file   Other Topics Concern  . Not on file   Social History Narrative  . No narrative on file  MD note: he is talking to his wife again; the other woman is out of the picture; that stress is gone  Interim medical history since last visit reviewed. Allergies  and medications reviewed  Review of Systems Per HPI unless specifically indicated above     Objective:    BP 118/64   Pulse 93   Temp 98.3 F (36.8 C) (Oral)   Resp 16   Wt 217 lb 12.8 oz (98.8 kg)   SpO2 96%   BMI 27.22 kg/m   Wt Readings from Last 3 Encounters:  08/02/16 217 lb 12.8 oz (98.8 kg)  05/04/16 219 lb 7 oz (99.5 kg)  02/02/16 214 lb (97.1 kg)    Physical Exam  Constitutional: He appears well-developed and well-nourished. No distress.  Eyes: No scleral icterus.  Neck: No JVD present.  Cardiovascular: Normal rate and regular rhythm.   Pulmonary/Chest: Effort normal and breath sounds normal. No respiratory distress.  Abdominal: He exhibits no distension.  Musculoskeletal: He exhibits no edema.  Neurological: He is alert.  Skin: No pallor.  Psychiatric: He has a normal mood and affect. His mood appears not anxious. His  speech is not delayed and not slurred. He is not slowed. He does not exhibit a depressed mood.  Quiet, soft-spoken; pleasant; good eye contact with examiner   Results for orders placed or performed in visit on 05/04/16  Lipid panel  Result Value Ref Range   Cholesterol 184 <200 mg/dL   Triglycerides 409 <811 mg/dL   HDL 45 >91 mg/dL   Total CHOL/HDL Ratio 4.1 <5.0 Ratio   VLDL 26 <30 mg/dL   LDL Cholesterol 478 (H) <100 mg/dL  Hemoglobin G9F  Result Value Ref Range   Hgb A1c MFr Bld 5.3 <5.7 %   Mean Plasma Glucose 105 mg/dL  COMPLETE METABOLIC PANEL WITH GFR  Result Value Ref Range   Sodium 140 135 - 146 mmol/L   Potassium 4.0 3.5 - 5.3 mmol/L   Chloride 101 98 - 110 mmol/L   CO2 29 20 - 31 mmol/L   Glucose, Bld 91 65 - 99 mg/dL   BUN 15 7 - 25 mg/dL   Creat 6.21 3.08 - 6.57 mg/dL   Total Bilirubin 0.5 0.2 - 1.2 mg/dL   Alkaline Phosphatase 67 40 - 115 U/L   AST 34 10 - 35 U/L   ALT 29 9 - 46 U/L   Total Protein 7.6 6.1 - 8.1 g/dL   Albumin 4.4 3.6 - 5.1 g/dL   Calcium 9.9 8.6 - 84.6 mg/dL   GFR, Est African American 89 >=60 mL/min   GFR, Est Non African American 77 >=60 mL/min      Assessment & Plan:   Problem List Items Addressed This Visit      Cardiovascular and Mediastinum   Hypertension    Excellent control        Digestive   Fatty liver    Working on healthy eating, weight management; last SGPT normal        Endocrine   IFG (impaired fasting glucose)    Last A1c normal range; continue healthy eating, activity, weight management        Other   Overweight (BMI 25.0-29.9)    Patient working on this      Colon cancer screening    Patient agrees to cologuard testing      Benzodiazepine use agreement exists    Patient is aware of risk of death if combining alcohol and benzo      Anxiety    Patient agrees to taper his benzo further; continue the sertraline; will decrease the actual dose of the lorazepam pill; see AVS for other strategies  for  dealing with anxiety      Relevant Medications   LORazepam (ATIVAN) 0.5 MG tablet    Other Visit Diagnoses    Screen for colon cancer    -  Primary   Relevant Orders   Cologuard       Follow up plan: Return in about 3 months (around 11/02/2016) for twenty minute follow-up with fasting labs.  An after-visit summary was printed and given to the patient at check-out.  Please see the patient instructions which may contain other information and recommendations beyond what is mentioned above in the assessment and plan.  Meds ordered this encounter  Medications  . LORazepam (ATIVAN) 0.5 MG tablet    Sig: Take 0.5-1 tablets (0.25-0.5 mg total) by mouth 2 (two) times daily as needed for anxiety.    Dispense:  30 tablet    Refill:  1    Pharmacist -- we're decreasing the dose  . fexofenadine (ALLEGRA) 180 MG tablet    Sig: Take 1 tablet (180 mg total) by mouth daily as needed for allergies or rhinitis.    Dispense:  30 tablet    Refill:  12    Orders Placed This Encounter  Procedures  . Cologuard

## 2016-09-12 ENCOUNTER — Encounter: Payer: Managed Care, Other (non HMO) | Admitting: Family Medicine

## 2016-09-22 ENCOUNTER — Ambulatory Visit: Payer: Self-pay | Admitting: Physician Assistant

## 2016-11-02 ENCOUNTER — Encounter: Payer: Self-pay | Admitting: Family Medicine

## 2016-11-02 ENCOUNTER — Ambulatory Visit (INDEPENDENT_AMBULATORY_CARE_PROVIDER_SITE_OTHER): Payer: Managed Care, Other (non HMO) | Admitting: Family Medicine

## 2016-11-02 VITALS — BP 118/64 | HR 73 | Temp 97.5°F | Resp 16 | Wt 223.4 lb

## 2016-11-02 DIAGNOSIS — G4733 Obstructive sleep apnea (adult) (pediatric): Secondary | ICD-10-CM | POA: Insufficient documentation

## 2016-11-02 DIAGNOSIS — R7301 Impaired fasting glucose: Secondary | ICD-10-CM

## 2016-11-02 DIAGNOSIS — E781 Pure hyperglyceridemia: Secondary | ICD-10-CM | POA: Diagnosis not present

## 2016-11-02 DIAGNOSIS — Z0289 Encounter for other administrative examinations: Secondary | ICD-10-CM | POA: Diagnosis not present

## 2016-11-02 DIAGNOSIS — F419 Anxiety disorder, unspecified: Secondary | ICD-10-CM

## 2016-11-02 DIAGNOSIS — K760 Fatty (change of) liver, not elsewhere classified: Secondary | ICD-10-CM

## 2016-11-02 DIAGNOSIS — I10 Essential (primary) hypertension: Secondary | ICD-10-CM

## 2016-11-02 DIAGNOSIS — K625 Hemorrhage of anus and rectum: Secondary | ICD-10-CM

## 2016-11-02 DIAGNOSIS — Z79899 Other long term (current) drug therapy: Secondary | ICD-10-CM

## 2016-11-02 HISTORY — DX: Obstructive sleep apnea (adult) (pediatric): G47.33

## 2016-11-02 LAB — CBC WITH DIFFERENTIAL/PLATELET
BASOS PCT: 0 %
Basophils Absolute: 0 cells/uL (ref 0–200)
EOS PCT: 4 %
Eosinophils Absolute: 268 cells/uL (ref 15–500)
HCT: 40.6 % (ref 38.5–50.0)
HEMOGLOBIN: 13.5 g/dL (ref 13.2–17.1)
LYMPHS ABS: 2479 {cells}/uL (ref 850–3900)
Lymphocytes Relative: 37 %
MCH: 29.3 pg (ref 27.0–33.0)
MCHC: 33.3 g/dL (ref 32.0–36.0)
MCV: 88.1 fL (ref 80.0–100.0)
MONO ABS: 737 {cells}/uL (ref 200–950)
MPV: 9.3 fL (ref 7.5–12.5)
Monocytes Relative: 11 %
NEUTROS PCT: 48 %
Neutro Abs: 3216 cells/uL (ref 1500–7800)
PLATELETS: 155 10*3/uL (ref 140–400)
RBC: 4.61 MIL/uL (ref 4.20–5.80)
RDW: 14.4 % (ref 11.0–15.0)
WBC: 6.7 10*3/uL (ref 3.8–10.8)

## 2016-11-02 MED ORDER — LORAZEPAM 0.5 MG PO TABS: 0.2500 mg | ORAL_TABLET | Freq: Two times a day (BID) | ORAL | 2 refills | Status: DC | PRN

## 2016-11-02 NOTE — Assessment & Plan Note (Signed)
Check A1c and glucose 

## 2016-11-02 NOTE — Assessment & Plan Note (Signed)
Use relaxation techniques when able; benzo for trigger moments

## 2016-11-02 NOTE — Assessment & Plan Note (Signed)
Improved with diet and weight loss

## 2016-11-02 NOTE — Assessment & Plan Note (Signed)
NCCS[place] web site reviewed; no red flags; no alcohol with benzo, patient aware; refills provided; return in 3 months

## 2016-11-02 NOTE — Assessment & Plan Note (Signed)
Well controlled today.

## 2016-11-02 NOTE — Patient Instructions (Addendum)
Cut down to 1,000 iu of vitamin D3 daily on average We'll get labs today If you have not heard anything from my staff in a week about any orders/referrals/studies from today, please contact us here to follow-up [phone] Return in 3 months  12 Ways to Curb Anxiety  ?Anxiety is normal human sensation. It is what helped our ancestors survive the pitfalls of the wilderness. Anxiety is defined as experiencing worry or nervousness about an imminent event or something with an uncertain outcome. It is a feeling experienced by most people at some point in their lives. Anxiety can be triggered by a very personal issue, such as the illness of a loved one, or an event of global proportions, such as a refugee crisis. Some of the symptoms of anxiety are:  Feeling restless.  Having a feeling of impending danger.  Increased heart rate.  Rapid breathing. Sweating.  Shaking.  Weakness or feeling tired.  Difficulty concentrating on anything except the current worry.  Insomnia.  Stomach or bowel problems. What can we do about anxiety we may be feeling? There are many techniques to help manage stress and relax. Here are 12 ways you can reduce your anxiety almost immediately: 1. Turn off the constant feed of information. Take a social media sabbatical. Studies have shown that social media directly contributes to social anxiety.  2. Monitor your television viewing habits. Are you watching shows that are also contributing to your anxiety, such as 24-hour news stations? Try watching something else, or better yet, nothing at all. Instead, listen to music, read an inspirational book or practice a hobby. 3. Eat nutritious meals. Also, don't skip meals and keep healthful snacks on hand. Hunger and poor diet contributes to feeling anxious. 4. Sleep. Sleeping on a regular schedule for at least seven to eight hours a night will do wonders for your outlook when you are awake. 5. Exercise. Regular exercise will help rid  your body of that anxious energy and help you get more restful sleep. 6. Try deep (diaphragmatic) breathing. Inhale slowly through your nose for five seconds and exhale through your mouth. 7. Practice acceptance and gratitude. When anxiety hits, accept that there are things out of your control that shouldn't be of immediate concern.  8. Seek out humor. When anxiety strikes, watch a funny video, read jokes or call a friend who makes you laugh. Laughter is healing for our bodies and releases endorphins that are calming. 9. Stay positive. Take the effort to replace negative thoughts with positive ones. Try to see a stressful situation in a positive light. Try to come up with solutions rather than dwelling on the problem. 10. Figure out what triggers your anxiety. Keep a journal and make note of anxious moments and the events surrounding them. This will help you identify triggers you can avoid or even eliminate. 11. Talk to someone. Let a trusted friend, family member or even trained professional know that you are feeling overwhelmed and anxious. Verbalize what you are feeling and why.  12. Volunteer. If your anxiety is triggered by a crisis on a large scale, become an advocate and work to resolve the problem that is causing you unease. Anxiety is often unwelcome and can become overwhelming. If not kept in check, it can become a disorder that could require medical treatment. However, if you take the time to care for yourself and avoid the triggers that make you anxious, you will be able to find moments of relaxation and clarity that make  your life much more enjoyable.

## 2016-11-02 NOTE — Progress Notes (Signed)
BP 118/64   Pulse 73   Temp (!) 97.5 F (36.4 C) (Oral)   Resp 16   Wt 223 lb 6.4 oz (101.3 kg)   SpO2 95%   BMI 27.92 kg/m    Subjective:    Patient ID: Richard [name]., male    DOB: [birth year], 56 y.o.   MRN: [id number]  HPI: Richard [name] is a 56 y.o. male  Chief Complaint  Patient presents with  . Follow-up    3 month with fasting labs    HPI Patient is here for 3 month f/u with fasting labs  Anxiety; using lorazepam; anxiety is doing pretty well; getting into confrontations with inmate or family will trigger; medicine does not make him feel drunk; knows to not drink any alcohol with this medicine  Hx of prediabetes; previous A1c was 6.0 in March of 2017; last A1c was 5.3 in February 2018; healthier eating; also working on weight  HTN; not often checking away from doctor; tries to limit salt  High cholesterol; not many fast foods any more; not many eggs; trying to cut back fatty meats; high cholesterol does not run in the family Lab Results  Component Value Date   CHOL 184 05/04/2016   HDL 45 05/04/2016   LDLCALC 113 (H) 05/04/2016   TRIG 128 05/04/2016   CHOLHDL 4.1 05/04/2016   He has sleep apnea and uses CPAP; Resmed handles that; feels refreshed upon wakening  Sometimes gets blood in his stool; thinks it might be hemorrhoids; bright red; just with bowel movements; never had a colonoscopy; blood has been going on for years  Depression screen [facility] 2/9 11/02/2016 08/02/2016 05/04/2016 02/02/2016 01/04/2016  Decreased Interest 0 0 0 0 0  Down, Depressed, Hopeless 0 0 0 0 0  PHQ - 2 Score 0 0 0 0 0    Relevant past medical, surgical, family and social history reviewed Past Medical History:  Diagnosis Date  . Agoraphobia without history of panic disorder   . Anxiety   . Depression   . Dizziness   . Elevated serum glutamic pyruvic transaminase (SGPT) level   . Fatty liver 09/10/2015  . High triglycerides 09/10/2015  . Hypertension   . IFG (impaired fasting  glucose)   . Kidney stone   . Low HDL (under 40)   . Obstructive sleep apnea 11/02/2016  . Overweight (BMI 25.0-29.9) 11/06/2014  . Snoring 09/10/2015  . Vitamin D deficiency    Past Surgical History:  Procedure Laterality Date  . NASAL SINUS SURGERY  2008   Family History  Problem Relation Age of Onset  . Heart disease Father        heart attack age 48  . Heart attack Father   . Hyperlipidemia Father   . Sleep apnea Father   . Cancer Brother        hodgins, liver cancer  . Cancer Brother    Social History   Social History  . Marital status: Married    Spouse name: N/A  . Number of children: N/A  . Years of education: N/A   Occupational History  . Not on file.   Social History Main Topics  . Smoking status: Former Smoker    Packs/day: 1.00    Years: 18.00    Types: Cigarettes    Quit date: 03/28/1990  . Smokeless tobacco: Current User    Types: Snuff  . Alcohol use 0.6 oz/week    1 Cans of beer per week  Comment: occasionally  . Drug use: No  . Sexual activity: Not Currently   Other Topics Concern  . Not on file   Social History Narrative  . No narrative on file    Interim medical history since last visit reviewed. Allergies and medications reviewed  Review of Systems  Constitutional: Negative for unexpected weight change.  Gastrointestinal: Negative for abdominal pain and blood in stool.   Per HPI unless specifically indicated above     Objective:    BP 118/64   Pulse 73   Temp (!) 97.5 F (36.4 C) (Oral)   Resp 16   Wt 223 lb 6.4 oz (101.3 kg)   SpO2 95%   BMI 27.92 kg/m   Wt Readings from Last 3 Encounters:  11/02/16 223 lb 6.4 oz (101.3 kg)  08/02/16 217 lb 12.8 oz (98.8 kg)  05/04/16 219 lb 7 oz (99.5 kg)    Physical Exam  Constitutional: He appears well-developed and well-nourished. No distress.  Eyes: No scleral icterus.  Neck: No JVD present.  Cardiovascular: Normal rate and regular rhythm.   Pulmonary/Chest: Effort normal and  breath sounds normal. No respiratory distress.  Abdominal: He exhibits no distension.  Musculoskeletal: He exhibits no edema.  Neurological: He is alert.  Skin: No pallor.  Psychiatric: He has a normal mood and affect. His mood appears not anxious. His speech is not delayed and not slurred. He is not slowed. He does not exhibit a depressed mood.  Quiet, soft-spoken; pleasant; good eye contact with examiner    Results for orders placed or performed in visit on 05/04/16  Lipid panel  Result Value Ref Range   Cholesterol 184 <200 mg/dL   Triglycerides 098 <119 mg/dL   HDL 45 >14 mg/dL   Total CHOL/HDL Ratio 4.1 <5.0 Ratio   VLDL 26 <30 mg/dL   LDL Cholesterol 782 (H) <100 mg/dL  Hemoglobin N5A  Result Value Ref Range   Hgb A1c MFr Bld 5.3 <5.7 %   Mean Plasma Glucose 105 mg/dL  COMPLETE METABOLIC PANEL WITH GFR  Result Value Ref Range   Sodium 140 135 - 146 mmol/L   Potassium 4.0 3.5 - 5.3 mmol/L   Chloride 101 98 - 110 mmol/L   CO2 29 20 - 31 mmol/L   Glucose, Bld 91 65 - 99 mg/dL   BUN 15 7 - 25 mg/dL   Creat 2.13 0.86 - 5.78 mg/dL   Total Bilirubin 0.5 0.2 - 1.2 mg/dL   Alkaline Phosphatase 67 40 - 115 U/L   AST 34 10 - 35 U/L   ALT 29 9 - 46 U/L   Total Protein 7.6 6.1 - 8.1 g/dL   Albumin 4.4 3.6 - 5.1 g/dL   Calcium 9.9 8.6 - 46.9 mg/dL   GFR, Est African American 89 >=60 mL/min   GFR, Est Non African American 77 >=60 mL/min      Assessment & Plan:   Problem List Items Addressed This Visit      Cardiovascular and Mediastinum   Hypertension    Well-controlled today        Respiratory   Obstructive sleep apnea    Using CPAP, using Resmed; working well        Digestive   Fatty liver    Check labs; expect improved numbers with weight control and healthier eating      Relevant Orders   COMPLETE METABOLIC PANEL WITH GFR     Endocrine   IFG (impaired fasting glucose)  Check A1c and glucose      Relevant Orders   Hemoglobin A1c     Other   High  triglycerides    Improved with diet and weight loss      Relevant Orders   Lipid panel   Controlled substance agreement signed    Renewed contract today; UDS per protocol      Benzodiazepine use agreement exists    NCCSRS web site reviewed; no red flags; no alcohol with benzo, patient aware; refills provided; return in 3 months      Anxiety - Primary    Use relaxation techniques when able; benzo for trigger moments      Relevant Medications   LORazepam (ATIVAN) 0.5 MG tablet    Other Visit Diagnoses    BRBPR (bright red blood per rectum)       Relevant Orders   Ambulatory referral to Gastroenterology   CBC with Differential/Platelet       Follow up plan: Return in about 3 months (around 02/02/2017) for follow-up visit with Dr. Sherie DonLada.  An after-visit summary was printed and given to the patient at check-out.  Please see the patient instructions which may contain other information and recommendations beyond what is mentioned above in the assessment and plan.  Meds ordered this encounter  Medications  . LORazepam (ATIVAN) 0.5 MG tablet    Sig: Take 0.5-1 tablets (0.25-0.5 mg total) by mouth 2 (two) times daily as needed for anxiety.    Dispense:  30 tablet    Refill:  2    Pharmacist -- we're decreasing the dose; cancel any outstanding refills of other benzo    Orders Placed This Encounter  Procedures  . CBC with Differential/Platelet  . COMPLETE METABOLIC PANEL WITH GFR  . Hemoglobin A1c  . Lipid panel  . Ambulatory referral to Gastroenterology

## 2016-11-02 NOTE — Assessment & Plan Note (Signed)
Renewed contract today; UDS per protocol

## 2016-11-02 NOTE — Assessment & Plan Note (Signed)
Using CPAP, using Resmed; working well

## 2016-11-02 NOTE — Assessment & Plan Note (Signed)
Check labs; expect improved numbers with weight control and healthier eating

## 2016-11-03 ENCOUNTER — Other Ambulatory Visit: Payer: Self-pay | Admitting: Family Medicine

## 2016-11-03 LAB — COMPLETE METABOLIC PANEL WITH GFR
ALBUMIN: 4.3 g/dL (ref 3.6–5.1)
ALK PHOS: 74 U/L (ref 40–115)
ALT: 25 U/L (ref 9–46)
AST: 25 U/L (ref 10–35)
BUN: 13 mg/dL (ref 7–25)
CHLORIDE: 104 mmol/L (ref 98–110)
CO2: 25 mmol/L (ref 20–32)
Calcium: 9.4 mg/dL (ref 8.6–10.3)
Creat: 0.97 mg/dL (ref 0.70–1.33)
GFR, EST NON AFRICAN AMERICAN: 88 mL/min (ref >=60)
GFR, Est African American: >89 mL/min (ref >=60)
GLUCOSE: 96 mg/dL (ref 65–99)
POTASSIUM: 4.1 mmol/L (ref 3.5–5.3)
SODIUM: 142 mmol/L (ref 135–146)
Total Bilirubin: 0.4 mg/dL (ref 0.2–1.2)
Total Protein: 7.2 g/dL (ref 6.1–8.1)

## 2016-11-03 LAB — LIPID PANEL
CHOLESTEROL: 161 mg/dL (ref <200)
HDL: 43 mg/dL (ref >40)
LDL Cholesterol: 97 mg/dL (ref <100)
TRIGLYCERIDES: 105 mg/dL (ref <150)
Total CHOL/HDL Ratio: 3.7 Ratio (ref <5.0)
VLDL: 21 mg/dL (ref <30)

## 2016-11-03 LAB — HEMOGLOBIN A1C
HEMOGLOBIN A1C: 5.6 % (ref <5.7)
Mean Plasma Glucose: 114 mg/dL

## 2016-11-09 ENCOUNTER — Encounter: Payer: Self-pay | Admitting: Family Medicine

## 2016-11-24 ENCOUNTER — Telehealth: Payer: Self-pay | Admitting: Family Medicine

## 2016-11-24 NOTE — Telephone Encounter (Signed)
Please follow-up with patient about the outstanding Cologuard order We strongly encourage colon cancer screening, as colon cancer is the 3rd leading cause of death from cancer in both men and women, and early detection saves lives Thank you 

## 2016-11-25 NOTE — Telephone Encounter (Signed)
Pt States he has the box still just haven't did it. I mention to the pt that he can call and see if he can just send the box with the sample or do they have reactive the order. Pt understood.

## 2016-12-05 ENCOUNTER — Ambulatory Visit: Payer: Managed Care, Other (non HMO) | Admitting: Gastroenterology

## 2017-02-02 ENCOUNTER — Ambulatory Visit: Payer: Managed Care, Other (non HMO) | Admitting: Family Medicine

## 2017-02-03 ENCOUNTER — Encounter: Payer: Self-pay | Admitting: Family Medicine

## 2017-02-03 ENCOUNTER — Ambulatory Visit: Payer: Managed Care, Other (non HMO) | Admitting: Family Medicine

## 2017-02-03 DIAGNOSIS — Z0289 Encounter for other administrative examinations: Secondary | ICD-10-CM | POA: Diagnosis not present

## 2017-02-03 DIAGNOSIS — F419 Anxiety disorder, unspecified: Secondary | ICD-10-CM | POA: Diagnosis not present

## 2017-02-03 DIAGNOSIS — Z79899 Other long term (current) drug therapy: Secondary | ICD-10-CM

## 2017-02-03 DIAGNOSIS — F4002 Agoraphobia without panic disorder: Secondary | ICD-10-CM | POA: Diagnosis not present

## 2017-02-03 MED ORDER — LORAZEPAM 0.5 MG PO TABS: 0.2500 mg | ORAL_TABLET | Freq: Two times a day (BID) | ORAL | 2 refills | Status: DC | PRN

## 2017-02-03 NOTE — Assessment & Plan Note (Signed)
On sertraline and PRN ativan

## 2017-02-03 NOTE — Patient Instructions (Addendum)
Take advantage of the counseling services available at [facility]  12 Ways to Curb Anxiety  ?Anxiety is normal human sensation. It is what helped our ancestors survive the pitfalls of the wilderness. Anxiety is defined as experiencing worry or nervousness about an imminent event or something with an uncertain outcome. It is a feeling experienced by most people at some point in their lives. Anxiety can be triggered by a very personal issue, such as the illness of a loved one, or an event of global proportions, such as a refugee crisis. Some of the symptoms of anxiety are:  Feeling restless.  Having a feeling of impending danger.  Increased heart rate.  Rapid breathing. Sweating.  Shaking.  Weakness or feeling tired.  Difficulty concentrating on anything except the current worry.  Insomnia.  Stomach or bowel problems. What can we do about anxiety we may be feeling? There are many techniques to help manage stress and relax. Here are 12 ways you can reduce your anxiety almost immediately: 1. Turn off the constant feed of information. Take a social media sabbatical. Studies have shown that social media directly contributes to social anxiety.  2. Monitor your television viewing habits. Are you watching shows that are also contributing to your anxiety, such as 24-hour news stations? Try watching something else, or better yet, nothing at all. Instead, listen to music, read an inspirational book or practice a hobby. 3. Eat nutritious meals. Also, don't skip meals and keep healthful snacks on hand. Hunger and poor diet contributes to feeling anxious. 4. Sleep. Sleeping on a regular schedule for at least seven to eight hours a night will do wonders for your outlook when you are awake. 5. Exercise. Regular exercise will help rid your body of that anxious energy and help you get more restful sleep. 6. Try deep (diaphragmatic) breathing. Inhale slowly through your nose for five seconds and exhale through your  mouth. 7. Practice acceptance and gratitude. When anxiety hits, accept that there are things out of your control that shouldn't be of immediate concern.  8. Seek out humor. When anxiety strikes, watch a funny video, read jokes or call a friend who makes you laugh. Laughter is healing for our bodies and releases endorphins that are calming. 9. Stay positive. Take the effort to replace negative thoughts with positive ones. Try to see a stressful situation in a positive light. Try to come up with solutions rather than dwelling on the problem. 10. Figure out what triggers your anxiety. Keep a journal and make note of anxious moments and the events surrounding them. This will help you identify triggers you can avoid or even eliminate. 11. Talk to someone. Let a trusted friend, family member or even trained professional know that you are feeling overwhelmed and anxious. Verbalize what you are feeling and why.  12. Volunteer. If your anxiety is triggered by a crisis on a large scale, become an advocate and work to resolve the problem that is causing you unease. Anxiety is often unwelcome and can become overwhelming. If not kept in check, it can become a disorder that could require medical treatment. However, if you take the time to care for yourself and avoid the triggers that make you anxious, you will be able to find moments of relaxation and clarity that make your life much more enjoyable.

## 2017-02-03 NOTE — Assessment & Plan Note (Signed)
Continue the 150 mg daily sertraline; using ativan PRN; he will take advantage of the counseling services [facility] at [facility]; see AVS for other ideas on dealing with anxiety

## 2017-02-03 NOTE — Progress Notes (Signed)
BP 112/60   Pulse 79   Temp 97.8 F (36.6 C) (Oral)   Resp 16   Wt 232 lb (105.2 kg)   SpO2 99%   BMI 29.00 kg/m    Subjective:    Patient ID: Richard Sabal., male    DOB: Mar 26, 1961, 56 y.o.   MRN: [id number]  HPI: Richard [name] is a 56 y.o. male  Chief Complaint  Patient presents with  . 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 to day; most of stress is at work, but it's stable One week, he works [employer], Fri-Sat-Sun, then the next week Wed-Thurs; works well for him Sleeping okay Appetite is good He is on ativan; he takes it almost day; helps with the stress of work; does not make him groggy at all; still able to function He feels like the medicine helps him Other things include taking slow deep breaths, just stepping back He has access to counseling at work, through the [employer] No sharing of medicine  We reviewed his previous labs; cholesterol much better; A1c back to normal range  Depression screen [facility] 2/9 02/03/2017 11/02/2016 08/02/2016 05/04/2016 02/02/2016  Decreased Interest 0 0 0 0 0  Down, Depressed, Hopeless 0 0 0 0 0  PHQ - 2 Score 0 0 0 0 0    Relevant past medical, surgical, family and social history reviewed Past Medical History:  Diagnosis Date  . Agoraphobia without history of panic disorder   . Anxiety   . Depression   . Dizziness   . Elevated serum glutamic pyruvic transaminase (SGPT) level   . Fatty liver 09/10/2015  . High triglycerides 09/10/2015  . Hypertension   . IFG (impaired fasting glucose)   . Kidney stone   . Low HDL (under 40)   . Obstructive sleep apnea 11/02/2016  . Overweight (BMI 25.0-29.9) 11/06/2014  . Snoring 09/10/2015  . Vitamin D deficiency    Past Surgical History:  Procedure Laterality Date  . NASAL SINUS SURGERY  2008   Family History  Problem Relation Age of Onset  . Heart  disease Father        heart attack age 62  . Heart attack Father   . Hyperlipidemia Father   . Sleep apnea Father   . Cancer Brother        hodgins, liver cancer  . Cancer Brother    Social History   Socioeconomic History  . Marital status: Married    Spouse name: Not on file  . Number of children: Not on file  . Years of education: Not on file  . Highest education level: Not on file  Social Needs  . Financial resource strain: Not on file  . Food insecurity - worry: Not on file  . Food insecurity - inability: Not on file  . Transportation needs - medical: Not on file  . Transportation needs - non-medical: Not on file  Occupational History  . Not on file  Tobacco Use  . Smoking status: Former Smoker    Packs/day: 1.00    Years: 18.00    Pack years: 18.00    Types: Cigarettes    Last attempt to quit: 03/28/1990    Years since quitting: 26.8  . Smokeless tobacco: Current User    Types: Snuff  Substance and Sexual Activity  .  Alcohol use: Yes    Alcohol/week: 0.6 oz    Types: 1 Cans of beer per week    Comment: occasionally  . Drug use: No  . Sexual activity: Not Currently  Other Topics Concern  . Not on file  Social History Narrative  . Not on file    Interim medical history since last visit reviewed. Allergies and medications reviewed  Review of Systems Per HPI unless specifically indicated above     Objective:    BP 112/60   Pulse 79   Temp 97.8 F (36.6 C) (Oral)   Resp 16   Wt 232 lb (105.2 kg)   SpO2 99%   BMI 29.00 kg/m   Wt Readings from Last 3 Encounters:  02/03/17 232 lb (105.2 kg)  11/02/16 223 lb 6.4 oz (101.3 kg)  08/02/16 217 lb 12.8 oz (98.8 kg)    Physical Exam  Constitutional: He appears well-developed and well-nourished. No distress.  Eyes: No scleral icterus.  Cardiovascular: Normal rate and regular rhythm.  Pulmonary/Chest: Effort normal and breath sounds normal.  Neurological: He is alert.  Skin: No pallor.  Psychiatric: He  has a normal mood and affect. His mood appears not anxious. His speech is not slurred. He is not slowed. He does not exhibit a depressed mood.  Good eye contact with examiner   Results for orders placed or performed in visit on 11/02/16  CBC with Differential/Platelet  Result Value Ref Range   WBC 6.7 3.8 - 10.8 K/uL   RBC 4.61 4.20 - 5.80 MIL/uL   Hemoglobin 13.5 13.2 - 17.1 g/dL   HCT 40.6 38.5 - 50.0 %   MCV 88.1 80.0 - 100.0 fL   MCH 29.3 27.0 - 33.0 pg   MCHC 33.3 32.0 - 36.0 g/dL   RDW 14.4 11.0 - 15.0 %   Platelets 155 140 - 400 K/uL   MPV 9.3 7.5 - 12.5 fL   Neutro Abs 3,216 1,500 - 7,800 cells/uL   Lymphs Abs 2,479 850 - 3,900 cells/uL   Monocytes Absolute 737 200 - 950 cells/uL   Eosinophils Absolute 268 15 - 500 cells/uL   Basophils Absolute 0 0 - 200 cells/uL   Neutrophils Relative % 48 %   Lymphocytes Relative 37 %   Monocytes Relative 11 %   Eosinophils Relative 4 %   Basophils Relative 0 %   Smear Review Criteria for review not met   COMPLETE METABOLIC PANEL WITH GFR  Result Value Ref Range   Sodium 142 135 - 146 mmol/L   Potassium 4.1 3.5 - 5.3 mmol/L   Chloride 104 98 - 110 mmol/L   CO2 25 20 - 32 mmol/L   Glucose, Bld 96 65 - 99 mg/dL   BUN 13 7 - 25 mg/dL   Creat 0.97 0.70 - 1.33 mg/dL   Total Bilirubin 0.4 0.2 - 1.2 mg/dL   Alkaline Phosphatase 74 40 - 115 U/L   AST 25 10 - 35 U/L   ALT 25 9 - 46 U/L   Total Protein 7.2 6.1 - 8.1 g/dL   Albumin 4.3 3.6 - 5.1 g/dL   Calcium 9.4 8.6 - 10.3 mg/dL   GFR, Est African American >89 >=60 mL/min   GFR, Est Non African American 88 >=60 mL/min  Hemoglobin A1c  Result Value Ref Range   Hgb A1c MFr Bld 5.6 <5.7 %   Mean Plasma Glucose 114 mg/dL  Lipid panel  Result Value Ref Range   Cholesterol 161 <200 mg/dL  Triglycerides 105 <150 mg/dL   HDL 43 >40 mg/dL   Total CHOL/HDL Ratio 3.7 <5.0 Ratio   VLDL 21 <30 mg/dL   LDL Cholesterol 97 <100 mg/dL      Assessment & Plan:   Problem List Items  Addressed This Visit      Other   Controlled substance agreement signed    Controlled substance agreement UTD; reviewed [place] web site; no red flags; no concerns at this time for misuse or diversion      Chronic anxiety    Continue the 150 mg daily sertraline; using ativan PRN; he will take advantage of the counseling services available at [facility]; see AVS for other ideas on dealing with anxiety      Relevant Medications   LORazepam (ATIVAN) 0.5 MG tablet   Benzodiazepine use agreement exists    Last UDS UTD      Agoraphobia without history of panic disorder    On sertraline and PRN ativan      Relevant Medications   LORazepam (ATIVAN) 0.5 MG tablet       Follow up plan: Return in about 3 months (around 05/06/2017).  An after-visit summary was printed and given to the patient at [place].  Please see the patient instructions which may contain other information and recommendations beyond what is mentioned above in the assessment and plan.  Meds ordered this encounter  Medications  . LORazepam (ATIVAN) 0.5 MG tablet    Sig: Take 0.5-1 tablets (0.25-0.5 mg total) 2 (two) times daily as needed by mouth for anxiety.    Dispense:  30 tablet    Refill:  2    Pharmacist -- fill on or after March 01, 2017    No orders of the defined types were placed in this encounter.

## 2017-02-03 NOTE — Assessment & Plan Note (Signed)
Controlled substance agreement UTD; [place]; no red flags; no concerns at this time for misuse or diversion

## 2017-02-03 NOTE — Assessment & Plan Note (Signed)
Last UDS UTD

## 2017-05-08 ENCOUNTER — Ambulatory Visit: Payer: Managed Care, Other (non HMO) | Admitting: Family Medicine

## 2017-10-19 ENCOUNTER — Encounter: Payer: Self-pay | Admitting: Family Medicine

## 2017-10-19 ENCOUNTER — Ambulatory Visit (INDEPENDENT_AMBULATORY_CARE_PROVIDER_SITE_OTHER): Payer: 59 | Admitting: Family Medicine

## 2017-10-19 VITALS — BP 126/74 | HR 88 | Temp 98.3°F | Resp 12 | Ht 75.0 in | Wt 246.0 lb

## 2017-10-19 DIAGNOSIS — Z0000 Encounter for general adult medical examination without abnormal findings: Secondary | ICD-10-CM

## 2017-10-19 DIAGNOSIS — Z1211 Encounter for screening for malignant neoplasm of colon: Secondary | ICD-10-CM

## 2017-10-19 MED ORDER — AZELASTINE HCL 0.1 % NA SOLN: 2.0000 | Freq: Two times a day (BID) | NASAL | 11 refills | Status: DC

## 2017-10-19 MED ORDER — SERTRALINE HCL 100 MG PO TABS: 50.0000 mg | ORAL_TABLET | Freq: Every day | ORAL | 0 refills | Status: DC

## 2017-10-19 MED ORDER — FEXOFENADINE HCL 180 MG PO TABS: 180.0000 mg | ORAL_TABLET | Freq: Every day | ORAL | 12 refills | Status: DC | PRN

## 2017-10-19 NOTE — Assessment & Plan Note (Signed)
USPSTF grade A and B recommendations reviewed with patient; age-appropriate recommendations, preventive care, screening tests, etc discussed and encouraged; healthy living encouraged; see AVS for patient education given to patient  

## 2017-10-19 NOTE — Progress Notes (Signed)
BP 126/74   Pulse 88   Temp 98.3 F (36.8 C) (Oral)   Resp 12   Ht '6\' 3"'$  (1.905 m)   Wt 246 lb (111.6 kg)   SpO2 96%   BMI 30.75 kg/m    Subjective:    Patient ID: Richard [name]., male    DOB: 1960-06-05, 57 y.o.   MRN: [id number]  HPI: Richard [name] is a 57 y.o. male  Chief Complaint  Patient presents with  . 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; not seeing any body for that  He needs refills of his medicines; he was without insurance for a while  He just had labs done for life insurance; reviewed today; given to [place] to add to our flowsheet  USPSTF grade A and B recommendations Depression: he was fired in January; 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; working out, not as much stress Depression screen [facility] 2/9 10/19/2017 02/03/2017 11/02/2016 08/02/2016 05/04/2016  Decreased Interest 0 0 0 0 0  Down, Depressed, Hopeless 3 0 0 0 0  PHQ - 2 Score 3 0 0 0 0  Altered sleeping 1 - - - -  Tired, decreased energy 3 - - - -  Change in appetite 3 - - - -  Feeling bad or failure about yourself  0 - - - -  Trouble concentrating 1 - - - -  Moving slowly or fidgety/restless 0 - - - -  Suicidal thoughts 0 - - - -  PHQ-9 Score 11 - - - -  Difficult doing work/chores Somewhat difficult - - - -   Hypertension: BP Readings from Last 3 Encounters:  10/19/17 126/74  02/03/17 112/60  11/02/16 118/64   Obesity: weight gain Wt Readings from Last 3 Encounters:  10/19/17 246 lb (111.6 kg)  02/03/17 232 lb (105.2 kg)  11/02/16 223 lb 6.4 oz (101.3 kg)   BMI Readings from Last 3 Encounters:  10/19/17 30.75 kg/m  02/03/17 29.00 kg/m  11/02/16 27.92 kg/m    Immunizations:  Tetanus UTD; flu shots discussed, will  get this fall; shingrix discussed Got the booster for MMR at the hospital Skin cancer: nothing worrisome Lung cancer:  [place], but no cigarettes Prostate cancer: start at age 50; no fam hx; gets up 1-2 x a night, most of the time, not all the time, otherwise, urinating fine No results found for: PSA just had PSA done through life insurance Colorectal cancer: Cologuard was ordered, never done, but he says he will do it AAA: n/a Aspirin: taking aleve for back and hips and knees; walking on concrete floors for years; getting progressive worse; pursuing disability he says, for that and PTSD; not seeing psychiatrist right now; he will see employee health for mental health Diet: not much fast food; not eating fruits and veggies every day Exercise: not any regular exercise Alcohol: very little Tobacco use: skoal pouches, thought about quitting, but still thinking HIV, hep B, hep C: hep C negative, hep B negative September 27, 2017 STD testing and prevention (chl/gon/syphilis): no need, no high risk Glucose: checked recently, reviewed Glucose, Bld  Date Value Ref Range Status  11/02/2016 96 65 -  99 mg/dL Final  05/04/2016 91 65 - 99 mg/dL Final  10/03/2015 115 (H) 65 - 99 mg/dL Final   Glucose-Capillary  Date Value Ref Range Status  10/03/2015 121 (H) 65 - 99 mg/dL Final   Lipids:  Lab Results  Component Value Date   CHOL 161 11/02/2016   CHOL 184 05/04/2016   CHOL 145 06/17/2015   Lab Results  Component Value Date   HDL 43 11/02/2016   HDL 45 05/04/2016   HDL 28 (L) 06/17/2015   Lab Results  Component Value Date   LDLCALC 97 11/02/2016   LDLCALC 113 (H) 05/04/2016   LDLCALC 55 06/17/2015   Lab Results  Component Value Date   TRIG 105 11/02/2016   TRIG 128 05/04/2016   TRIG 310 (H) 06/17/2015   Lab Results  Component Value Date   CHOLHDL 3.7 11/02/2016   CHOLHDL 4.1 05/04/2016   No results found for: LDLDIRECT   Depression screen [facility] 2/9 10/19/2017 02/03/2017 11/02/2016  08/02/2016 05/04/2016  Decreased Interest 0 0 0 0 0  Down, Depressed, Hopeless 3 0 0 0 0  PHQ - 2 Score 3 0 0 0 0  Altered sleeping 1 - - - -  Tired, decreased energy 3 - - - -  Change in appetite 3 - - - -  Feeling bad or failure about yourself  0 - - - -  Trouble concentrating 1 - - - -  Moving slowly or fidgety/restless 0 - - - -  Suicidal thoughts 0 - - - -  PHQ-9 Score 11 - - - -  Difficult doing work/chores Somewhat difficult - - - -    Relevant past medical, surgical, family and social history reviewed Past Medical History:  Diagnosis Date  . Agoraphobia without history of panic disorder   . Anxiety   . Depression   . Dizziness   . Elevated serum glutamic pyruvic transaminase (SGPT) level   . Fatty liver 09/10/2015  . High triglycerides 09/10/2015  . Hypertension   . IFG (impaired fasting glucose)   . Kidney stone   . Low HDL (under 40)   . Obstructive sleep apnea 11/02/2016  . Overweight (BMI 25.0-29.9) 11/06/2014  . Snoring 09/10/2015  . Vitamin D deficiency    Past Surgical History:  Procedure Laterality Date  . NASAL SINUS SURGERY  2008   Family History  Problem Relation Age of Onset  . Heart disease Father        heart attack age 72  . Heart attack Father   . Hyperlipidemia Father   . Sleep apnea Father   . Colon polyps Father   . Cancer Brother        hodgins, liver cancer  . Cancer Brother    Social History   Tobacco Use  . Smoking status: Former Smoker    Packs/day: 1.00    Years: 18.00    Pack years: 18.00    Types: Cigarettes    Last attempt to quit: 03/28/1990    Years since quitting: 27.5  . Smokeless tobacco: Current User    Types: Snuff  Substance Use Topics  . Alcohol use: Yes    Alcohol/week: 0.6 oz    Types: 1 Cans of beer per week    Comment: occasionally  . Drug use: No    Interim medical history since last visit reviewed. Allergies and medications reviewed  Review of Systems Per HPI unless specifically indicated above       Objective:  BP 126/74   Pulse 88   Temp 98.3 F (36.8 C) (Oral)   Resp 12   Ht '6\' 3"'$  (1.905 m)   Wt 246 lb (111.6 kg)   SpO2 96%   BMI 30.75 kg/m   Wt Readings from Last 3 Encounters:  10/19/17 246 lb (111.6 kg)  02/03/17 232 lb (105.2 kg)  11/02/16 223 lb 6.4 oz (101.3 kg)    Physical Exam  Constitutional: He appears well-developed and well-nourished. No distress.  Obese, weight gain noted  HENT:  Head: Normocephalic and atraumatic.  Nose: Nose normal.  Mouth/Throat: Oropharynx is clear and moist.  Eyes: EOM are normal. No scleral icterus.  Neck: No JVD present. No thyromegaly present.  Cardiovascular: Normal rate, regular rhythm and normal heart sounds.  Pulmonary/Chest: Effort normal and breath sounds normal. No respiratory distress. He has no wheezes. He has no rales.  Abdominal: Soft. Bowel sounds are normal. He exhibits no distension. There is no tenderness. There is no guarding.  Musculoskeletal: He exhibits no edema.  Lymphadenopathy:    He has no cervical adenopathy.  Neurological: He is alert. He displays normal reflexes. He exhibits normal muscle tone. Coordination normal.  Skin: Skin is warm and dry. No rash noted. He is not diaphoretic. No erythema. No pallor.  Psychiatric: He has a normal mood and affect. His behavior is normal. Judgment and thought content normal.       Assessment & Plan:   Problem List Items Addressed This Visit      Other   Preventative health care - Primary    USPSTF grade A and B recommendations reviewed with patient; age-appropriate recommendations, preventive care, screening tests, etc discussed and encouraged; healthy living encouraged; see AVS for patient education given to patient        Other Visit Diagnoses    Colon cancer screening       Relevant Orders   Cologuard   Screen for colon cancer           Follow up plan: Return in about 1 year (around 10/20/2018) for complete physical; ONE WEEK for other issues.  An  after-visit summary was printed and given to the patient at [facility].  Please see the patient instructions which may contain other information and recommendations beyond what is mentioned above in the assessment and plan.  Meds ordered this encounter  Medications  . azelastine (ASTELIN) 0.1 % nasal spray    Sig: Place 2 sprays into both nostrils 2 (two) times daily. Use in each nostril as directed    Dispense:  30 mL    Refill:  11  . fexofenadine (ALLEGRA) 180 MG tablet    Sig: Take 1 tablet (180 mg total) by mouth daily as needed for allergies or rhinitis.    Dispense:  30 tablet    Refill:  12  . sertraline (ZOLOFT) 100 MG tablet    Sig: Take 0.5 tablets (50 mg total) by mouth daily. For two weeks, then one whole pill daily    Dispense:  44 tablet    Refill:  0    We're increasing dose    Orders Placed This Encounter  Procedures  . Cologuard

## 2017-10-19 NOTE — Patient Instructions (Addendum)
Consider getting the new shingles vaccine called Shingrix; that is available for individuals 55 years of age and older, and is recommended even if you have had shingles in the past and/or already received the old shingles vaccine (Zostavax); it is a two-part series, and is available at many local pharmacies  Return in the next week or so to discuss your prediabetes   Health Maintenance, Male A healthy lifestyle and preventive care is important for your health and wellness. Ask your health care provider about what schedule of regular examinations is right for you. What should I know about weight and diet? Eat a Healthy Diet  Eat plenty of vegetables, fruits, whole grains, low-fat dairy products, and lean protein.  Do not eat a lot of foods high in solid fats, added sugars, or salt.  Maintain a Healthy Weight Regular exercise can help you achieve or maintain a healthy weight. You should:  Do at least 150 minutes of exercise each week. The exercise should increase your heart rate and make you sweat (moderate-intensity exercise).  Do strength-training exercises at least twice a week.  Watch Your Levels of Cholesterol and Blood Lipids  Have your blood tested for lipids and cholesterol every 5 years starting at 55 years of age. If you are at high risk for heart disease, you should start having your blood tested when you are 55 years old. You may need to have your cholesterol levels checked more often if: ? Your lipid or cholesterol levels are high. ? You are older than 55 years of age. ? You are at high risk for heart disease.  What should I know about cancer screening? Many types of cancers can be detected early and may often be prevented. Lung Cancer  You should be screened every year for lung cancer if: ? You are a current smoker who has smoked for at least 30 years. ? You are a former smoker who has quit within the past 15 years.  Talk to your health care provider about your  screening options, when you should start screening, and how often you should be screened.  Colorectal Cancer  Routine colorectal cancer screening usually begins at 55 years of age and should be repeated every 5-10 years until you are 55 years old. You may need to be screened more often if early forms of precancerous polyps or small growths are found. Your health care provider may recommend screening at an earlier age if you have risk factors for colon cancer.  Your health care provider may recommend using home test kits to check for hidden blood in the stool.  A small camera at the end of a tube can be used to examine your colon (sigmoidoscopy or colonoscopy). This checks for the earliest forms of colorectal cancer.  Prostate and Testicular Cancer  Depending on your age and overall health, your health care provider may do certain tests to screen for prostate and testicular cancer.  Talk to your health care provider about any symptoms or concerns you have about testicular or prostate cancer.  Skin Cancer  Check your skin from head to toe regularly.  Tell your health care provider about any new moles or changes in moles, especially if: ? There is a change in a mole's size, shape, or color. ? You have a mole that is larger than a pencil eraser.  Always use sunscreen. Apply sunscreen liberally and repeat throughout the day.  Protect yourself by wearing long sleeves, pants, a wide-brimmed hat, and sunglasses when  outside.  What should I know about heart disease, diabetes, and high blood pressure?  If you are 37-58 years of age, have your blood pressure checked every 3-5 years. If you are 36 years of age or older, have your blood pressure checked every year. You should have your blood pressure measured twice-once when you are at a hospital or clinic, and once when you are not at a hospital or clinic. Record the average of the two measurements. To check your blood pressure when you are not at  a hospital or clinic, you can use: ? An automated blood pressure machine at a pharmacy. ? A home blood pressure monitor.  Talk to your health care provider about your target blood pressure.  If you are between 4-76 years old, ask your health care provider if you should take aspirin to prevent heart disease.  Have regular diabetes screenings by checking your fasting blood sugar level. ? If you are at a normal weight and have a low risk for diabetes, have this test once every three years after the age of 106. ? If you are overweight and have a high risk for diabetes, consider being tested at a younger age or more often.  A one-time screening for abdominal aortic aneurysm (AAA) by ultrasound is recommended for men aged 104-75 years who are current or former smokers. What should I know about preventing infection? Hepatitis B If you have a higher risk for hepatitis B, you should be screened for this virus. Talk with your health care provider to find out if you are at risk for hepatitis B infection. Hepatitis C Blood testing is recommended for:  Everyone born from [birth year] through 1963.  Anyone with known risk factors for hepatitis C.  Sexually Transmitted Diseases (STDs)  You should be screened each year for STDs including gonorrhea and chlamydia if: ? You are sexually active and are younger than 55 years of age. ? You are older than 55 years of age and your health care provider tells you that you are at risk for this type of infection. ? Your sexual activity has changed since you were last screened and you are at an increased risk for chlamydia or gonorrhea. Ask your health care provider if you are at risk.  Talk with your health care provider about whether you are at high risk of being infected with HIV. Your health care provider may recommend a prescription medicine to help prevent HIV infection.  What else can I do?  Schedule regular health, dental, and eye exams.  Stay current with  your vaccines (immunizations).  Do not use any tobacco products, such as cigarettes, chewing tobacco, and e-cigarettes. If you need help quitting, ask your health care provider.  Limit alcohol intake to no more than 2 drinks per day. One drink equals 12 ounces of beer, 5 ounces of wine, or 1 ounces of hard liquor.  Do not use street drugs.  Do not share needles.  Ask your health care provider for help if you need support or information about quitting drugs.  Tell your health care provider if you often feel depressed.  Tell your health care provider if you have ever been abused or do not feel safe at home. This information is not intended to replace advice given to you by your health care provider. Make sure you discuss any questions you have with your health care provider. Document Released: 09/10/2007 Document Revised: 11/11/2015 Document Reviewed: 12/16/2014 Elsevier Interactive Patient Education  [employer].  Preventing Type 2 Diabetes Mellitus Type 2 diabetes (type 2 diabetes mellitus) is a long-term (chronic) disease that affects blood sugar (glucose) levels. Normally, a hormone called insulin allows glucose to enter cells in the body. The cells use glucose for energy. In type 2 diabetes, one or both of these problems may be present:  The body does not make enough insulin.  The body does not respond properly to insulin that it makes (insulin resistance).  Insulin resistance or lack of insulin causes excess glucose to build up in the blood instead of going into cells. As a result, high blood glucose (hyperglycemia) develops, which can cause many complications. Being overweight or obese and having an inactive (sedentary) lifestyle can increase your risk for diabetes. Type 2 diabetes can be delayed or prevented by making certain nutrition and lifestyle changes. What nutrition changes can be made?  Eat healthy meals and snacks regularly. Keep a healthy snack with you for when  you get hungry between meals, such as fruit or a handful of nuts.  Eat lean meats and proteins that are low in saturated fats, such as chicken, fish, egg whites, and beans. Avoid processed meats.  Eat plenty of fruits and vegetables and plenty of grains that have not been processed (whole grains). It is recommended that you eat: ? 1?2 cups of fruit every day. ? 2?3 cups of vegetables every day. ? 6?8 oz of whole grains every day, such as oats, whole wheat, bulgur, brown rice, quinoa, and millet.  Eat low-fat dairy products, such as milk, yogurt, and cheese.  Eat foods that contain healthy fats, such as nuts, avocado, olive oil, and canola oil.  Drink water throughout the day. Avoid drinks that contain added sugar, such as soda or sweet tea.  Follow instructions from your health care provider about specific eating or drinking restrictions.  Control how much food you eat at a time (portion size). ? Check food labels to find out the serving sizes of foods. ? Use a kitchen scale to weigh amounts of foods.  Saute or steam food instead of frying it. Cook with water or broth instead of oils or butter.  Limit your intake of: ? Salt (sodium). Have no more than 1 tsp (2,400 mg) of sodium a day. If you have heart disease or high blood pressure, have less than ? tsp (1,500 mg) of sodium a day. ? Saturated fat. This is fat that is solid at room temperature, such as butter or fat on meat. What lifestyle changes can be made?  Activity  Do moderate-intensity physical activity for at least 30 minutes on at least 5 days of the week, or as much as told by your health care provider.  Ask your health care provider what activities are safe for you. A mix of physical activities may be best, such as walking, swimming, cycling, and strength training.  Try to add physical activity into your day. For example: ? Park in spots that are farther away than usual, so that you walk more. For example, park in a  far corner of the parking lot when you go to the office or the grocery store. ? Take a walk during your lunch break. ? Use stairs instead of elevators or escalators. Weight Loss  Lose weight as directed. Your health care provider can determine how much weight loss is best for you and can help you lose weight safely.  If you are overweight or obese, you may be instructed to lose at least 5?7 %  of your body weight. Alcohol and Tobacco   Limit alcohol intake to no more than 1 drink a day for nonpregnant women and 2 drinks a day for men. One drink equals 12 oz of beer, 5 oz of wine, or 1 oz of hard liquor.  Do not use any tobacco products, such as cigarettes, chewing tobacco, and e-cigarettes. If you need help quitting, ask your health care provider. Work With Your Health Care Provider  Have your blood glucose tested regularly, as told by your health care provider.  Discuss your risk factors and how you can reduce your risk for diabetes.  Get screening tests as told by your health care provider. You may have screening tests regularly, especially if you have certain risk factors for type 2 diabetes.  Make an appointment with a diet and nutrition specialist (registered dietitian). A registered dietitian can help you make a healthy eating plan and can help you understand portion sizes and food labels. Why are these changes important?  It is possible to prevent or delay type 2 diabetes and related health problems by making lifestyle and nutrition changes.  It can be difficult to recognize signs of type 2 diabetes. The best way to avoid possible damage to your body is to take actions to prevent the disease before you develop symptoms. What can happen if changes are not made?  Your blood glucose levels may keep increasing. Having high blood glucose for a long time is dangerous. Too much glucose in your blood can damage your blood vessels, heart, kidneys, nerves, and eyes.  You may develop  prediabetes or type 2 diabetes. Type 2 diabetes can lead to many chronic health problems and complications, such as: ? Heart disease. ? Stroke. ? Blindness. ? Kidney disease. ? Depression. ? Poor circulation in the feet and legs, which could lead to surgical removal (amputation) in severe cases. Where to find support:  Ask your health care provider to recommend a registered dietitian, diabetes educator, or weight loss program.  Look for local or online weight loss groups.  Join a gym, fitness club, or outdoor activity group, such as a walking club. Where to find more information: To learn more about diabetes and diabetes prevention, visit:  American Diabetes Association (ADA): www.diabetes.[employer] of Diabetes and Digestive and Kidney Diseases: ToyArticles.ca  To learn more about healthy eating, visit:  The U[place] Department of Agriculture Architect), Choose My Plate: http://yates.biz/  Office of Disease Prevention and Health Promotion (ODPHP), Dietary Guidelines: ListingMagazine.si  Summary  You can reduce your risk for type 2 diabetes by increasing your physical activity, eating healthy foods, and losing weight as directed.  Talk with your health care provider about your risk for type 2 diabetes. Ask about any blood tests or screening tests that you need to have. This information is not intended to replace advice given to you by your health care provider. Make sure you discuss any questions you have with your health care provider. Document Released: 07/06/2015 Document Revised: 08/20/2015 Document Reviewed: 05/05/2015 Elsevier Interactive Patient Education  [employer].

## 2017-10-27 ENCOUNTER — Ambulatory Visit
Admission: RE | Admit: 2017-10-27 | Discharge: 2017-10-27 | Disposition: A | Discharge status: Home / Self Care | Payer: 59 | Source: Ambulatory Visit | Attending: Family Medicine | Admitting: Family Medicine

## 2017-10-27 ENCOUNTER — Ambulatory Visit (INDEPENDENT_AMBULATORY_CARE_PROVIDER_SITE_OTHER): Payer: 59 | Admitting: Family Medicine

## 2017-10-27 ENCOUNTER — Telehealth: Payer: Self-pay

## 2017-10-27 ENCOUNTER — Encounter: Payer: Self-pay | Admitting: Family Medicine

## 2017-10-27 VITALS — BP 108/72 | HR 73 | Temp 98.3°F | Resp 14 | Ht 75.0 in | Wt 243.6 lb

## 2017-10-27 DIAGNOSIS — M11262 Other chondrocalcinosis, left knee: Secondary | ICD-10-CM | POA: Insufficient documentation

## 2017-10-27 DIAGNOSIS — G8929 Other chronic pain: Secondary | ICD-10-CM

## 2017-10-27 DIAGNOSIS — M25552 Pain in left hip: Secondary | ICD-10-CM

## 2017-10-27 DIAGNOSIS — M5442 Lumbago with sciatica, left side: Principal | ICD-10-CM

## 2017-10-27 DIAGNOSIS — M25562 Pain in left knee: Secondary | ICD-10-CM | POA: Diagnosis not present

## 2017-10-27 DIAGNOSIS — F4310 Post-traumatic stress disorder, unspecified: Secondary | ICD-10-CM | POA: Insufficient documentation

## 2017-10-27 DIAGNOSIS — F4002 Agoraphobia without panic disorder: Secondary | ICD-10-CM

## 2017-10-27 DIAGNOSIS — M654 Radial styloid tenosynovitis [de Quervain]: Secondary | ICD-10-CM

## 2017-10-27 DIAGNOSIS — E559 Vitamin D deficiency, unspecified: Secondary | ICD-10-CM

## 2017-10-27 DIAGNOSIS — N200 Calculus of kidney: Secondary | ICD-10-CM | POA: Insufficient documentation

## 2017-10-27 DIAGNOSIS — M79641 Pain in right hand: Secondary | ICD-10-CM | POA: Diagnosis not present

## 2017-10-27 DIAGNOSIS — M2550 Pain in unspecified joint: Secondary | ICD-10-CM

## 2017-10-27 DIAGNOSIS — M1120 Other chondrocalcinosis, unspecified site: Secondary | ICD-10-CM

## 2017-10-27 DIAGNOSIS — M160 Bilateral primary osteoarthritis of hip: Secondary | ICD-10-CM | POA: Insufficient documentation

## 2017-10-27 MED ORDER — MELOXICAM 7.5 MG PO TABS: 7.5000 mg | ORAL_TABLET | Freq: Every day | ORAL | 0 refills | Status: DC | PRN

## 2017-10-27 MED ORDER — LORAZEPAM 0.5 MG PO TABS: 0.2500 mg | ORAL_TABLET | Freq: Two times a day (BID) | ORAL | 0 refills | Status: DC | PRN

## 2017-10-27 MED ORDER — WRIST BRACE MISC: 0 refills | Status: DC

## 2017-10-27 NOTE — Patient Instructions (Signed)
We'll get labs Please have the xrays done across the street If you have not heard anything from my staff in a week about any orders/referrals/studies from today, please contact us here to follow-up [phone]

## 2017-10-27 NOTE — Assessment & Plan Note (Signed)
Continue medicine; referring to psychiatrist for evaluation, treatment

## 2017-10-27 NOTE — Assessment & Plan Note (Signed)
Impacting his ability to work per patient; will refer to psychiatrist for evaluation, additional assessment and treatment; continue current medicine
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
## 2017-10-27 NOTE — Telephone Encounter (Signed)
-----   Message from Richard PasseyMelinda P [name], MD sent at 10/27/2017  4:12 PM EDT ----- Please let patient know that he hand xray did not show anything concerning

## 2017-10-30 LAB — VITAMIN D 25 HYDROXY (VIT D DEFICIENCY, FRACTURES): VIT D 25 HYDROXY: 32 ng/mL (ref 30–100)

## 2017-10-30 LAB — ANA,IFA RA DIAG PNL W/RFLX TIT/PATN
ANA: NEGATIVE
Cyclic Citrullin Peptide Ab: <16 UNITS
Rhuematoid fact SerPl-aCnc: 39 IU/mL — ABNORMAL HIGH (ref <14)

## 2017-10-30 LAB — MAGNESIUM: MAGNESIUM: 2.2 mg/dL (ref 1.5–2.5)

## 2017-10-30 LAB — C-REACTIVE PROTEIN: CRP: 2 mg/L (ref <8.0)

## 2017-11-14 DIAGNOSIS — M1120 Other chondrocalcinosis, unspecified site: Secondary | ICD-10-CM | POA: Insufficient documentation

## 2017-11-14 DIAGNOSIS — R768 Other specified abnormal immunological findings in serum: Secondary | ICD-10-CM | POA: Insufficient documentation

## 2017-11-14 DIAGNOSIS — M159 Polyosteoarthritis, unspecified: Secondary | ICD-10-CM | POA: Insufficient documentation

## 2017-11-17 ENCOUNTER — Encounter: Payer: Self-pay | Admitting: Family Medicine

## 2017-11-17 ENCOUNTER — Ambulatory Visit (INDEPENDENT_AMBULATORY_CARE_PROVIDER_SITE_OTHER): Payer: 59 | Admitting: Family Medicine

## 2017-11-17 VITALS — BP 114/62 | HR 62 | Temp 97.9°F | Resp 12 | Ht 75.0 in | Wt 241.3 lb

## 2017-11-17 DIAGNOSIS — G8929 Other chronic pain: Secondary | ICD-10-CM

## 2017-11-17 DIAGNOSIS — M5136 Other intervertebral disc degeneration, lumbar region: Secondary | ICD-10-CM

## 2017-11-17 DIAGNOSIS — M2550 Pain in unspecified joint: Secondary | ICD-10-CM | POA: Diagnosis not present

## 2017-11-17 DIAGNOSIS — M25562 Pain in left knee: Secondary | ICD-10-CM | POA: Diagnosis not present

## 2017-11-17 DIAGNOSIS — M25552 Pain in left hip: Secondary | ICD-10-CM | POA: Diagnosis not present

## 2017-11-17 DIAGNOSIS — M1120 Other chondrocalcinosis, unspecified site: Secondary | ICD-10-CM | POA: Diagnosis not present

## 2017-11-17 DIAGNOSIS — F4310 Post-traumatic stress disorder, unspecified: Secondary | ICD-10-CM

## 2017-11-17 NOTE — Patient Instructions (Addendum)
Please contact [facility] or [facility]  [facility] Address: [address], [place], [place] [address]  Phone: [phone]  [facility] Address: [address], [place], [place] [address]  Phone: [phone]  I'll complete your paperwork  Please do see the therapist for functional capacity evaluation

## 2017-11-17 NOTE — Progress Notes (Signed)
BP 114/62   Pulse 62   Temp 97.9 F (36.6 C) (Oral)   Resp 12   Ht 6\' 3"  (1.905 m)   Wt 241 lb 4.8 oz (109.5 kg)   SpO2 99%   BMI 30.16 kg/m    Subjective:    Patient ID: Richard LeatherwoodJames L Esch Jr., male    DOB: October 23, 1960, 57 y.o.   MRN: [id number]  HPI: Richard LeatherwoodJames L Vinsant Jr. is a 57 y.o. male  Chief Complaint  Patient presents with  . Follow-up    HPI Patient is here for f/u He just saw Dr. Tresa MooreBehalal-Bock on August 20th Positive rheumatoid factor Generalized osteoarthritis, per her note Chondrocalcinosis of the left knee, pseudogout; no treatment done at her visit; for the pain, she said he could take the meloxicam plus tylenol  She thinks it is all osteoarthritis causing him the pain Hep B negative and Hep C negative No M-spike  Pain level today is a 5 out of 10 With meloxicam, pain goes down to a 3 out of 10 but never completely gone Worst pain days are up to an 8 out of 10 Some days the "left hip don't want to do" Lower back takes an hour to get limbered up; stiff in the morning xrays showed multilevel DDD, facet arthropathy Kidney stones; not drinking enough water  PTSD Goes to see the psychiatrist; referred to psychiatrist on August 2nd   Dep[facility] screen [facility] 2/9 11/17/2017 10/19/2017 02/03/2017 11/02/2016 08/02/2016  Decreased Interest 0 0 0 0 0  Down, Depressed, Hopeless 0 3 0 0 0  PHQ - 2 Score 0 3 0 0 0  Altered sleeping 1 1 - - -  Tired, decreased energy 2 3 - - -  Change in appetite 0 3 - - -  Feeling bad or failure about yourself  0 0 - - -  Trouble concentrating 0 1 - - -  Moving slowly or fidgety/restless 0 0 - - -  Suicidal thoughts 0 0 - - -  PHQ-9 Score 3 11 - - -  Difficult doing work/chores Not difficult at all Somewhat difficult - - -    Relevant past medical, surgical, family and social history reviewed Past Medical History:  Diagnosis Date  . Agoraphobia without history of panic disorder   . Anxiety   . Depression   . Dizziness   . Elevated serum  glutamic pyruvic transaminase (SGPT) level   . Fatty liver 09/10/2015  . High triglycerides 09/10/2015  . Hypertension   . IFG (impaired fasting glucose)   . Kidney stone   . Low HDL (under 40)   . Obstructive sleep apnea 11/02/2016  . Overweight (BMI 25.0-29.9) 11/06/2014  . Snoring 09/10/2015  . Vitamin D deficiency    Past Surgical History:  Procedure Laterality Date  . NASAL SINUS SURGERY  2008   Family History  Problem Relation Age of Onset  . Heart disease Father        heart attack age 55  . Heart attack Father   . Hyperlipidemia Father   . Sleep apnea Father   . Colon polyps Father   . Cancer Brother        hodgins, liver cancer  . Cancer Brother    Social History   Tobacco Use  . Smoking status: Former Smoker    Packs/day: 1.00    Years: 18.00    Pack years: 18.00    Types: Cigarettes    Last attempt to quit: 03/28/1990  Years since quitting: 27.6  . Smokeless tobacco: Current User    Types: Snuff  Substance Use Topics  . Alcohol use: Yes    Alcohol/week: 1.0 standard drinks    Types: 1 Cans of beer per week    Comment: occasionally  . Drug use: No    Interim medical history since last visit reviewed. Allergies and medications reviewed  Review of Systems Per HPI unless specifically indicated above     Objective:    BP 114/62   Pulse 62   Temp 97.9 F (36.6 C) (Oral)   Resp 12   Ht 6\' 3"  (1.905 m)   Wt 241 lb 4.8 oz (109.5 kg)   SpO2 99%   BMI 30.16 kg/m   Wt Readings from Last 3 Encounters:  11/17/17 241 lb 4.8 oz (109.5 kg)  10/27/17 243 lb 9.6 oz (110.5 kg)  10/19/17 246 lb (111.6 kg)    Physical Exam  Constitutional: He appears well-developed and well-nourished. No distress.  Eyes: No scleral icterus.  Cardiovascular: Normal rate and regular rhythm.  Pulmonary/Chest: Effort normal and breath sounds normal.  Abdominal: He exhibits no distension.  Neurological: He is alert.  Skin: No pallor.  Psychiatric: He has a normal mood and  affect. His mood appears not anxious. His affect is not blunt. He is not agitated and not slowed. He does not express impulsivity. He does not exhibit a depressed mood.  Good eye contact with examiner; pleasant, excellent historian    Results for orders placed or performed in visit on 10/27/17  Magnesium  Result Value Ref Range   Magnesium 2.2 1.5 - 2.5 mg/dL  VITAMIN D 25 Hydroxy (Vit-D Deficiency, Fractures)  Result Value Ref Range   Vit D, 25-Hydroxy 32 30 - 100 ng/mL  ANA,IFA RA Diag Pnl w/rflx Tit/Patn  Result Value Ref Range   Anti Nuclear Antibody(ANA) NEGATIVE NEGATIVE   Rhuematoid fact SerPl-aCnc 39 (H) <14 IU/mL   Cyclic Citrullin Peptide Ab <40 UNITS   INTERPRETATION    C-reactive protein  Result Value Ref Range   CRP 2.0 <8.0 mg/L      Assessment & Plan:   Problem List Items Addressed This Visit      Musculoskeletal and Integument   Pseudogout    Refer to PT for FCE; paperwork for disability underway Patient has been seen by rheumatologist      Relevant Orders   Ambulatory referral to Physical Therapy   DDD (degenerative disc disease), lumbar    Refer to PT for FCE; paperwork for disability underway Patient has been seen by rheumatologist      Relevant Orders   Ambulatory referral to Physical Therapy     Other   PTSD (post-traumatic stress disorder)    Suspected PTSD; he will be going to see psychiatrist for this soon; he is considering using this as a diagnosis for disability; will defer that to psychiatrist      Pain, joint, multiple sites    Refer to PT for FCE; paperwork for disability underway Patient has been seen by rheumatologist as well      Relevant Orders   Ambulatory referral to Physical Therapy   Pain in joint of left hip    Refer to PT for FCE; paperwork for disability underway      Relevant Orders   Ambulatory referral to Physical Therapy   Chronic pain of left knee - Primary    Patient has been seen by rheumatologist Refer to  PT for FCE; paperwork  for disability underway      Relevant Orders   Ambulatory referral to Physical Therapy       Follow up plan: No follow-ups on file.  An after-visit summary was printed and given to the patient at check-out.  Please see the patient instructions which may contain other information and recommendations beyond what is mentioned above in the assessment and plan.  No orders of the defined types were placed in this encounter.   Orders Placed This Encounter  Procedures  . Ambulatory referral to Physical Therapy

## 2017-11-19 DIAGNOSIS — M5136 Other intervertebral disc degeneration, lumbar region: Secondary | ICD-10-CM | POA: Insufficient documentation

## 2017-11-19 DIAGNOSIS — M1120 Other chondrocalcinosis, unspecified site: Secondary | ICD-10-CM | POA: Insufficient documentation

## 2017-11-19 DIAGNOSIS — M2550 Pain in unspecified joint: Secondary | ICD-10-CM | POA: Insufficient documentation

## 2017-11-19 DIAGNOSIS — G8929 Other chronic pain: Secondary | ICD-10-CM | POA: Insufficient documentation

## 2017-11-19 DIAGNOSIS — M25562 Pain in left knee: Principal | ICD-10-CM

## 2017-11-19 DIAGNOSIS — M25552 Pain in left hip: Secondary | ICD-10-CM | POA: Insufficient documentation

## 2017-11-19 NOTE — Assessment & Plan Note (Signed)
Refer to PT for FCE; paperwork for disability underway Patient has been seen by rheumatologist as well

## 2017-11-19 NOTE — Assessment & Plan Note (Signed)
Refer to PT for FCE; paperwork for disability underway Patient has been seen by rheumatologist

## 2017-11-19 NOTE — Assessment & Plan Note (Signed)
Suspected PTSD; he will be going to see psychiatrist for this soon; he is considering using this as a diagnosis for disability; will defer that to psychiatrist

## 2017-11-19 NOTE — Assessment & Plan Note (Signed)
Refer to PT for FCE; paperwork for disability underway

## 2017-11-19 NOTE — Assessment & Plan Note (Signed)
Patient has been seen by rheumatologist Refer to PT for FCE; paperwork for disability underway

## 2017-11-19 NOTE — Assessment & Plan Note (Signed)
Refer to PT for FCE; paperwork for disability underway Patient has been seen by rheumatologist 

## 2017-11-23 ENCOUNTER — Other Ambulatory Visit: Payer: Self-pay | Admitting: Family Medicine

## 2017-11-23 NOTE — Telephone Encounter (Signed)
Last OV 8/23

## 2017-11-28 ENCOUNTER — Other Ambulatory Visit: Payer: Self-pay | Admitting: Family Medicine

## 2017-12-01 ENCOUNTER — Other Ambulatory Visit: Payer: Self-pay | Admitting: Family Medicine

## 2017-12-01 NOTE — Telephone Encounter (Signed)
Last ov:8/23

## 2017-12-06 MED ORDER — LORAZEPAM 0.5 MG PO TABS: 0.2500 mg | ORAL_TABLET | Freq: Two times a day (BID) | ORAL | 0 refills | Status: DC | PRN

## 2017-12-07 ENCOUNTER — Other Ambulatory Visit: Payer: Self-pay

## 2017-12-07 ENCOUNTER — Emergency Department: Payer: 59

## 2017-12-07 ENCOUNTER — Emergency Department
Admission: EM | Admit: 2017-12-07 | Discharge: 2017-12-07 | Disposition: A | Discharge status: Home / Self Care | Payer: 59 | Attending: Emergency Medicine | Admitting: Emergency Medicine

## 2017-12-07 DIAGNOSIS — N200 Calculus of kidney: Secondary | ICD-10-CM

## 2017-12-07 DIAGNOSIS — Z79899 Other long term (current) drug therapy: Secondary | ICD-10-CM | POA: Diagnosis not present

## 2017-12-07 DIAGNOSIS — I10 Essential (primary) hypertension: Secondary | ICD-10-CM | POA: Diagnosis not present

## 2017-12-07 DIAGNOSIS — Z87891 Personal history of nicotine dependence: Secondary | ICD-10-CM | POA: Insufficient documentation

## 2017-12-07 DIAGNOSIS — R1012 Left upper quadrant pain: Secondary | ICD-10-CM | POA: Diagnosis present

## 2017-12-07 LAB — CBC
HEMATOCRIT: 40 % (ref 40.0–52.0)
Hemoglobin: 14.3 g/dL (ref 13.0–18.0)
MCH: 31.1 pg (ref 26.0–34.0)
MCHC: 35.7 g/dL (ref 32.0–36.0)
MCV: 87.1 fL (ref 80.0–100.0)
Platelets: 173 10*3/uL (ref 150–440)
RBC: 4.59 MIL/uL (ref 4.40–5.90)
RDW: 13.7 % (ref 11.5–14.5)
WBC: 10.4 10*3/uL (ref 3.8–10.6)

## 2017-12-07 LAB — URINALYSIS, COMPLETE (UACMP) WITH MICROSCOPIC
BACTERIA UA: NONE SEEN
Bilirubin Urine: NEGATIVE
Glucose, UA: NEGATIVE mg/dL
HGB URINE DIPSTICK: NEGATIVE
Ketones, ur: NEGATIVE mg/dL
LEUKOCYTES UA: NEGATIVE
Nitrite: NEGATIVE
Protein, ur: NEGATIVE mg/dL
SQUAMOUS EPITHELIAL / LPF: NONE SEEN (ref 0–5)
Specific Gravity, Urine: 1.021 (ref 1.005–1.030)
pH: 5 (ref 5.0–8.0)

## 2017-12-07 LAB — BASIC METABOLIC PANEL
ANION GAP: 6 (ref 5–15)
BUN: 14 mg/dL (ref 6–20)
CHLORIDE: 105 mmol/L (ref 98–111)
CO2: 29 mmol/L (ref 22–32)
Calcium: 9.3 mg/dL (ref 8.9–10.3)
Creatinine, Ser: 1.11 mg/dL (ref 0.61–1.24)
GFR calc non Af Amer: >60 mL/min (ref >60)
Glucose, Bld: 88 mg/dL (ref 70–99)
Potassium: 3.8 mmol/L (ref 3.5–5.1)
SODIUM: 140 mmol/L (ref 135–145)

## 2017-12-07 MED ORDER — KETOROLAC TROMETHAMINE 30 MG/ML IJ SOLN
INTRAMUSCULAR | Status: AC
  Filled 2017-12-07: qty 1

## 2017-12-07 MED ORDER — ONDANSETRON HCL 4 MG/2ML IJ SOLN
4.0000 mg | Freq: Once | INTRAMUSCULAR | Status: AC
  Administered 2017-12-07: 4 mg via INTRAVENOUS

## 2017-12-07 MED ORDER — MORPHINE SULFATE (PF) 4 MG/ML IV SOLN
INTRAVENOUS | Status: AC
  Filled 2017-12-07: qty 1

## 2017-12-07 MED ORDER — MORPHINE SULFATE (PF) 4 MG/ML IV SOLN
4.0000 mg | Freq: Once | INTRAVENOUS | Status: AC
  Administered 2017-12-07: 4 mg via INTRAVENOUS

## 2017-12-07 MED ORDER — ONDANSETRON HCL 4 MG/2ML IJ SOLN
INTRAMUSCULAR | Status: AC
  Filled 2017-12-07: qty 2

## 2017-12-07 NOTE — ED Triage Notes (Signed)
Pt arrives to ED via POV with c/o left-sided flank pain x1 day. Pt denies N/V/D. No c/o hematuaria, but pt states urinating less today than normal. Pt reports h/x of both lower back pain and kidney stones.

## 2017-12-07 NOTE — ED Notes (Signed)
Paper charted until d/c and was d/c at [id number]

## 2017-12-07 NOTE — ED Provider Notes (Signed)
[facility] Emergency Department Provider Note    First MD Initiated Contact with Patient 12/07/17 0031     (approximate)  I have reviewed the triage vital signs and the nursing notes.   HISTORY  Chief Complaint Back Pain    HPI Richard LeatherwoodJames L Aldaco Jr. is a 57 y.o. male With below less chronic medical conditions including 2 previous episodes of kidney stones presents to the emergency department with acute onset of left flank pain that is 10 out of 10 radiating to the left groin without any concomitant symptoms area patient denies any fever no nausea vomiting. Patient denies any hematuria or dysuria.   Past Medical History:  Diagnosis Date  . Agoraphobia without history of panic disorder   . Anxiety   . Depression   . Dizziness   . Elevated serum glutamic pyruvic transaminase (SGPT) level   . Fatty liver 09/10/2015  . High triglycerides 09/10/2015  . Hypertension   . IFG (impaired fasting glucose)   . Kidney stone   . Low HDL (under 40)   . Obstructive sleep apnea 11/02/2016  . Overweight (BMI 25.0-29.9) 11/06/2014  . Snoring 09/10/2015  . Vitamin D deficiency     Patient Active Problem List   Diagnosis Date Noted  . Chronic pain of left knee 11/19/2017  . Pseudogout 11/19/2017  . Pain in joint of left hip 11/19/2017  . Pain, joint, multiple sites 11/19/2017  . DDD (degenerative disc disease), lumbar 11/19/2017  . PTSD (post-traumatic stress disorder) 10/27/2017  . Chronic midline low back pain with left-sided sciatica 10/27/2017  . Preventative health care 10/19/2017  . Obstructive sleep apnea 11/02/2016  . High triglycerides 09/10/2015  . Fatty liver 09/10/2015  . Controlled substance agreement signed 02/13/2015  . Medication monitoring encounter 11/06/2014  . Benzodiazepine use agreement exists 11/06/2014  . Overweight (BMI 25.0-29.9) 11/06/2014  . Allergic rhinitis 11/06/2014  . Low HDL (under 40)   . Chronic anxiety 10/21/2014  .  Hypertension 07/15/2014  . Depression 07/15/2014  . Agoraphobia without history of panic disorder   . Elevated serum glutamic pyruvic transaminase (SGPT) level     Past Surgical History:  Procedure Laterality Date  . NASAL SINUS SURGERY  2008    Prior to Admission medications   Medication Sig Start Date End Date Taking? Authorizing Provider  albuterol (PROAIR HFA) 108 (90 Base) MCG/ACT inhaler Inhale 2 puffs into the lungs every 6 (six) hours as needed. Patient not taking: Reported on 11/02/2016 08/17/15 11/02/16  Sherrie MustacheFisher, Roselyn BeringSusan W, PA-C  azelastine (ASTELIN) 0.1 % nasal spray Place 2 sprays into both nostrils 2 (two) times daily. Use in each nostril as directed 10/19/17   Kerman PasseyLada, Melinda P, MD  cholecalciferol (VITAMIN D) 1000 UNITS tablet Take 1,000 Units by mouth daily.    [provider]  fexofenadine (ALLEGRA) 180 MG tablet Take 1 tablet (180 mg total) by mouth daily as needed for allergies or rhinitis. 10/19/17   [name], Janit BernMelinda P, MD  LORazepam (ATIVAN) 0.5 MG tablet Take 0.5-1 tablets (0.25-0.5 mg total) by mouth 2 (two) times daily as needed for anxiety. 12/06/17   [name], Janit BernMelinda P, MD  meloxicam (MOBIC) 7.5 MG tablet Take 1 tablet (7.5 mg total) by mouth daily as needed for pain. Take with food 11/24/17   Cheryle HorsfallPoulose, Elizabeth E, NP  Misc. Devices (WRIST BRACE) MISC RIGHT wrist brace with thumb splint for Dequervains; NOT carpal tunnel; wear as much as possible 10/27/17   [name], Janit BernMelinda P, MD  Multiple  Vitamin (MULTIVITAMIN) tablet Take 1 tablet by mouth daily.    [provider]  sertraline (ZOLOFT) 100 MG tablet Take 0.5 tablets (50 mg total) by mouth daily. For two weeks, then one whole pill daily 12/03/17   Poulose, Percell Belt, NP  vitamin B-12 (CYANOCOBALAMIN) 1000 MCG tablet Take 1,000 mcg by mouth daily.     [provider]    Allergies Augmentin [amoxicillin-pot clavulanate]  Family History  Problem Relation Age of Onset  . Heart disease Father        heart  attack age 54  . Heart attack Father   . Hyperlipidemia Father   . Sleep apnea Father   . Colon polyps Father   . Cancer Brother        hodgins, liver cancer  . Cancer Brother     Social History Social History   Tobacco Use  . Smoking status: Former Smoker    Packs/day: 1.00    Years: 18.00    Pack years: 18.00    Types: Cigarettes    Last attempt to quit: 03/28/1990    Years since quitting: 27.7  . Smokeless tobacco: Current User    Types: Snuff  Substance Use Topics  . Alcohol use: Yes    Alcohol/week: 1.0 standard drinks    Types: 1 Cans of beer per week    Comment: occasionally  . Drug use: No    Review of Systems Constitutional: No fever/chills Eyes: No visual changes. ENT: No sore throat. Cardiovascular: Denies chest pain. Respiratory: Denies shortness of breath. Gastrointestinal: No abdominal pain.  No nausea, no vomiting.  No diarrhea.  No constipation. Genitourinary: Negative for dysuria.positive for left flank pain Musculoskeletal: Negative for neck pain.  Negative for back pain. Integumentary: Negative for rash. Neurological: Negative for headaches, focal weakness or numbness.   ____________________________________________   PHYSICAL EXAM:  VITAL SIGNS: ED Triage Vitals  Enc Vitals Group     BP 12/07/17 0009 139/87     Pulse Rate 12/07/17 0009 70     Resp 12/07/17 0009 18     Temp 12/07/17 0009 98.4 F (36.9 C)     Temp Source 12/07/17 0009 Oral     SpO2 12/07/17 0009 97 %     Weight 12/07/17 0007 108.9 kg (240 lb)     Height 12/07/17 0007 1.905 m (6\' 3" )     Head Circumference --      Peak Flow --      Pain Score 12/07/17 0007 6     Pain Loc --      Pain Edu? --      Excl. in GC? --     Constitutional: Alert and oriented. Well appearing and in no acute distress. Eyes: Conjunctivae are normal. Head: Atraumatic. Mouth/Throat: Mucous membranes are moist.  Oropharynx non-erythematous. Neck: No stridor.  Cardiovascular: Normal rate,  regular rhythm. Good peripheral circulation. Grossly normal heart sounds. Respiratory: Normal respiratory effort.  No retractions. Lungs CTAB. Gastrointestinal: Soft and nontender. No distention.  Musculoskeletal: No lower extremity tenderness nor edema. No gross deformities of extremities. Neurologic:  Normal speech and language. No gross focal neurologic deficits are appreciated.  Skin:  Skin is warm, dry and intact. No rash noted. Psychiatric: Mood and affect are normal. Speech and behavior are normal.  ____________________________________________   LABS (all labs ordered are listed, but only abnormal results are displayed)  Labs Reviewed  URINALYSIS, COMPLETE (UACMP) WITH MICROSCOPIC - Abnormal; Notable for the following components:  Result Value   Color, Urine YELLOW (*)    APPearance CLEAR (*)    All other components within normal limits  BASIC METABOLIC PANEL  CBC    RADIOLOGY I, Ritchey N BROWN, personally viewed and evaluated these images (plain radiographs) as part of my medical decision making, as well as reviewing the written report by the radiologist.  ED MD interpretation:  BiLateral nephrolithiasis.  Official radiology report(s): Ct Renal Stone Study  Result Date: 12/07/2017 CLINICAL DATA:  Left flank pain for 1 day. EXAM: CT ABDOMEN AND PELVIS WITHOUT CONTRAST TECHNIQUE: Multidetector CT imaging of the abdomen and pelvis was performed following the standard protocol without IV contrast. COMPARISON:  Lumbar spine radiograph 10/27/2017 FINDINGS: Lower chest: Calcified granuloma in the left lower lobe. No consolidation or pleural fluid. Hepatobiliary: Borderline hepatic steatosis. No discrete focal lesion. Gallbladder physiologically distended, no calcified stone. No biliary dilatation. Pancreas: No ductal dilatation or inflammation. Spleen: Normal in size without focal abnormality. Adrenals/Urinary Tract: Normal adrenal glands. Multiple bilateral nonobstructing  calculi in both kidneys. No hydronephrosis or perinephric edema. No ureteral calculi. Both ureters are decompressed. Urinary bladder is partially distended. No bladder stone. Small left bladder diverticulum. No urethral calculi visualized. Stomach/Bowel: Stomach is distended with ingested contents. Appendix appears normal. No evidence of bowel wall thickening, distention, or inflammatory changes. Vascular/Lymphatic: Normal caliber abdominopelvic vasculature. No adenopathy. Reproductive: Prostate is unremarkable. Other: No free air, free fluid, or intra-abdominal fluid collection. Musculoskeletal: There are no acute or suspicious osseous abnormalities. IMPRESSION: 1. Multiple bilateral nonobstructing renal calculi. No hydronephrosis or obstructive uropathy. No ureteral calculi. 2. No acute abnormality in the abdomen/pelvis. 3. Mild hepatic steatosis. Electronically Signed   By: Narda Rutherford M.D.   On: 12/07/2017 01:16      Procedures   ____________________________________________   INITIAL IMPRESSION / ASSESSMENT AND PLAN / ED COURSE  As part of my medical decision making, I reviewed the following data within the electronic MEDICAL RECORD NUMBER   57 year old male presenting with above stated history of physical exam concerning for ureterolithiasis. As such CT scan of the abdomen performed which revealed bilateral nephrolithiasis. Patient given IV morphine and Toradol emergency department with markedly improved pain. Patient be referred to Dr. Vanna Scotland for further outpatient evaluation.     ____________________________________________  FINAL CLINICAL IMPRESSION(S) / ED DIAGNOSES  Final diagnoses:  Kidney stone     MEDICATIONS GIVEN DURING THIS VISIT:  Medications  ketorolac (TORADOL) 30 MG/ML injection (has no administration in time range)  morphine 4 MG/ML injection 4 mg (4 mg Intravenous Not Given 12/07/17 0058)  ondansetron (ZOFRAN) injection 4 mg ( Intravenous Not Given  12/07/17 0058)     ED Discharge Orders    None       Note:  This document was prepared using Dragon voice recognition software and may include unintentional dictation errors.    Darci Current, MD 12/07/17 [phone]

## 2017-12-27 ENCOUNTER — Other Ambulatory Visit: Payer: Self-pay | Admitting: Family Medicine

## 2017-12-27 ENCOUNTER — Other Ambulatory Visit: Payer: Self-pay | Admitting: Nurse Practitioner

## 2017-12-27 DIAGNOSIS — G8929 Other chronic pain: Secondary | ICD-10-CM

## 2017-12-27 DIAGNOSIS — M5442 Lumbago with sciatica, left side: Principal | ICD-10-CM

## 2017-12-31 NOTE — Telephone Encounter (Signed)
Already refilled

## 2018-01-08 ENCOUNTER — Other Ambulatory Visit: Payer: Self-pay | Admitting: Family Medicine

## 2018-01-09 NOTE — Telephone Encounter (Signed)
Left detailed voicemail

## 2018-01-09 NOTE — Telephone Encounter (Signed)
I referred patient to psychiatry more than two months ago He is hopefully seeing a psychiatrist by now I'll ask that he contact his psychiatrist to refills Back to me if there are any issues

## 2018-01-31 ENCOUNTER — Other Ambulatory Visit: Payer: Self-pay

## 2018-01-31 DIAGNOSIS — M5442 Lumbago with sciatica, left side: Principal | ICD-10-CM

## 2018-01-31 DIAGNOSIS — G8929 Other chronic pain: Secondary | ICD-10-CM

## 2018-01-31 MED ORDER — MELOXICAM 7.5 MG PO TABS: 7.5000 mg | ORAL_TABLET | Freq: Every day | ORAL | 0 refills | Status: DC | PRN

## 2018-02-05 ENCOUNTER — Other Ambulatory Visit: Payer: Self-pay | Admitting: Nurse Practitioner

## 2018-03-07 ENCOUNTER — Other Ambulatory Visit: Payer: Self-pay | Admitting: Family Medicine

## 2018-03-07 DIAGNOSIS — G8929 Other chronic pain: Secondary | ICD-10-CM

## 2018-03-07 DIAGNOSIS — M5442 Lumbago with sciatica, left side: Principal | ICD-10-CM

## 2018-03-08 NOTE — Telephone Encounter (Signed)
Lab Results  Component Value Date   CREATININE 1.11 12/07/2017   Richard BradfordKimberly, E-prescribing is listed as "unavailable" for this Please phone this prescription in for meloxicam to [facility]  Meds ordered this encounter  Medications  . meloxicam (MOBIC) 7.5 MG tablet    Sig: Take 1 tablet (7.5 mg total) by mouth daily as needed for pain. Take with food    Dispense:  30 tablet    Refill:  1

## 2018-03-08 NOTE — Telephone Encounter (Signed)
Mobic called into pharmacy.

## 2018-05-11 ENCOUNTER — Telehealth: Payer: Self-pay | Admitting: Family Medicine

## 2018-05-11 NOTE — Telephone Encounter (Signed)
Left detailed VM, CRM created.  

## 2018-05-11 NOTE — Telephone Encounter (Signed)
Patient has care gap for colon cancer screening and an unresolved Cologuard order Please contact patient, urge them to complete the Cologuard kit; offer to re-order if needed Thank you

## 2018-05-16 ENCOUNTER — Other Ambulatory Visit: Payer: Self-pay | Admitting: Family Medicine

## 2018-05-16 DIAGNOSIS — G8929 Other chronic pain: Secondary | ICD-10-CM

## 2018-05-16 DIAGNOSIS — M5442 Lumbago with sciatica, left side: Principal | ICD-10-CM

## 2018-05-16 NOTE — Telephone Encounter (Signed)
Lab Results  Component Value Date   CREATININE 1.11 12/07/2017

## 2018-06-17 ENCOUNTER — Other Ambulatory Visit: Payer: Self-pay | Admitting: Family Medicine

## 2018-06-17 DIAGNOSIS — G8929 Other chronic pain: Secondary | ICD-10-CM

## 2018-06-17 DIAGNOSIS — M5442 Lumbago with sciatica, left side: Principal | ICD-10-CM

## 2018-07-10 ENCOUNTER — Encounter: Payer: Self-pay | Admitting: *Deleted

## 2018-07-10 ENCOUNTER — Emergency Department: Payer: Worker's Compensation

## 2018-07-10 ENCOUNTER — Other Ambulatory Visit: Payer: Self-pay

## 2018-07-10 ENCOUNTER — Emergency Department
Admission: EM | Admit: 2018-07-10 | Discharge: 2018-07-10 | Disposition: A | Discharge status: Home / Self Care | Payer: Worker's Compensation | Attending: Emergency Medicine | Admitting: Emergency Medicine

## 2018-07-10 DIAGNOSIS — W51XXXA Accidental striking against or bumped into by another person, initial encounter: Secondary | ICD-10-CM | POA: Insufficient documentation

## 2018-07-10 DIAGNOSIS — Y998 Other external cause status: Secondary | ICD-10-CM | POA: Diagnosis not present

## 2018-07-10 DIAGNOSIS — Z87891 Personal history of nicotine dependence: Secondary | ICD-10-CM | POA: Diagnosis not present

## 2018-07-10 DIAGNOSIS — Y9389 Activity, other specified: Secondary | ICD-10-CM | POA: Insufficient documentation

## 2018-07-10 DIAGNOSIS — S0990XA Unspecified injury of head, initial encounter: Secondary | ICD-10-CM | POA: Insufficient documentation

## 2018-07-10 DIAGNOSIS — Y9289 Other specified places as the place of occurrence of the external cause: Secondary | ICD-10-CM | POA: Diagnosis not present

## 2018-07-10 NOTE — ED Notes (Signed)
Pt does not feel urge to urinate at this time, pt given a cup of water.
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
## 2018-07-10 NOTE — ED Notes (Signed)
Spoke with [employer] [phone] [employer] about UDS d/t WC profile saying "only upon request." Officer Hill stated "?just go ahead and do one." Informed Officer Hill that we would preform one.
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
## 2018-07-10 NOTE — ED Notes (Signed)
Completed worker's comp urine/drug screening, walked the urine test to the lab by 7:44.
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
## 2018-07-29 ENCOUNTER — Other Ambulatory Visit: Payer: Self-pay

## 2018-07-29 DIAGNOSIS — R2232 Localized swelling, mass and lump, left upper limb: Secondary | ICD-10-CM | POA: Diagnosis present

## 2018-07-29 DIAGNOSIS — L02511 Cutaneous abscess of right hand: Secondary | ICD-10-CM | POA: Insufficient documentation

## 2018-07-29 DIAGNOSIS — Z87891 Personal history of nicotine dependence: Secondary | ICD-10-CM | POA: Insufficient documentation

## 2018-07-29 DIAGNOSIS — Z79899 Other long term (current) drug therapy: Secondary | ICD-10-CM | POA: Diagnosis not present

## 2018-07-29 DIAGNOSIS — I10 Essential (primary) hypertension: Secondary | ICD-10-CM | POA: Diagnosis not present

## 2018-07-30 ENCOUNTER — Other Ambulatory Visit: Payer: Self-pay

## 2018-07-30 ENCOUNTER — Emergency Department
Admission: EM | Admit: 2018-07-30 | Discharge: 2018-07-30 | Disposition: A | Discharge status: Home / Self Care | Payer: 59 | Attending: Student in an Organized Health Care Education/Training Program | Admitting: Student in an Organized Health Care Education/Training Program

## 2018-07-30 DIAGNOSIS — L0291 Cutaneous abscess, unspecified: Secondary | ICD-10-CM

## 2018-07-30 LAB — BASIC METABOLIC PANEL
Anion gap: 10 (ref 5–15)
BUN: 15 mg/dL (ref 6–20)
CO2: 27 mmol/L (ref 22–32)
Calcium: 9 mg/dL (ref 8.9–10.3)
Chloride: 104 mmol/L (ref 98–111)
Creatinine, Ser: 1.09 mg/dL (ref 0.61–1.24)
GFR calc Af Amer: >60 mL/min (ref >60)
GFR calc non Af Amer: >60 mL/min (ref >60)
Glucose, Bld: 115 mg/dL — ABNORMAL HIGH (ref 70–99)
Potassium: 3.6 mmol/L (ref 3.5–5.1)
Sodium: 141 mmol/L (ref 135–145)

## 2018-07-30 LAB — CBC WITH DIFFERENTIAL/PLATELET
Abs Immature Granulocytes: 0.04 10*3/uL (ref 0.00–0.07)
Basophils Absolute: 0.1 10*3/uL (ref 0.0–0.1)
Basophils Relative: 1 %
Eosinophils Absolute: 0.2 10*3/uL (ref 0.0–0.5)
Eosinophils Relative: 2 %
HCT: 42.8 % (ref 39.0–52.0)
Hemoglobin: 14.2 g/dL (ref 13.0–17.0)
Immature Granulocytes: 0 %
Lymphocytes Relative: 20 %
Lymphs Abs: 2.2 10*3/uL (ref 0.7–4.0)
MCH: 29 pg (ref 26.0–34.0)
MCHC: 33.2 g/dL (ref 30.0–36.0)
MCV: 87.5 fL (ref 80.0–100.0)
Monocytes Absolute: 0.9 10*3/uL (ref 0.1–1.0)
Monocytes Relative: 8 %
Neutro Abs: 7.4 10*3/uL (ref 1.7–7.7)
Neutrophils Relative %: 69 %
Platelets: 177 10*3/uL (ref 150–400)
RBC: 4.89 MIL/uL (ref 4.22–5.81)
RDW: 12.9 % (ref 11.5–15.5)
WBC: 10.8 10*3/uL — ABNORMAL HIGH (ref 4.0–10.5)
nRBC: 0 % (ref 0.0–0.2)

## 2018-07-30 MED ORDER — SULFAMETHOXAZOLE-TRIMETHOPRIM 800-160 MG PO TABS
1.0000 | ORAL_TABLET | Freq: Once | ORAL | Status: AC
  Administered 2018-07-30: 1 via ORAL
  Filled 2018-07-30: qty 1

## 2018-07-30 MED ORDER — LIDOCAINE HCL (PF) 1 % IJ SOLN
5.0000 mL | Freq: Once | INTRAMUSCULAR | Status: AC
  Administered 2018-07-30: 01:00:00 5 mL via INTRADERMAL
  Filled 2018-07-30: qty 5

## 2018-07-30 MED ORDER — ACETAMINOPHEN 500 MG PO TABS
1000.0000 mg | ORAL_TABLET | Freq: Once | ORAL | Status: AC
  Administered 2018-07-30: 1000 mg via ORAL
  Filled 2018-07-30: qty 2

## 2018-07-30 MED ORDER — SULFAMETHOXAZOLE-TRIMETHOPRIM 800-160 MG PO TABS: 1.0000 | ORAL_TABLET | Freq: Two times a day (BID) | ORAL | 0 refills | Status: AC
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
## 2018-07-30 NOTE — ED Notes (Signed)
md in to see pt.  

## 2018-07-30 NOTE — ED Provider Notes (Signed)
[facility] Emergency Department Provider Note    First MD Initiated Contact with Patient 07/30/18 [phone]     (approximate)  I have reviewed the triage vital signs and the nursing notes.   HISTORY  Chief Complaint Insect Bite    HPI Richard LeatherwoodJames L Romito Jr. is a 58 y.o. male presents the ER for evaluation of swelling and pain to the left hand.  He is right-hand dominant.  States that he got bit by a spider several days ago.  Has not noticed any drainage but feels like the pain has been getting progressively worse.  Denies any measured fevers.  No history of diabetes.  No history of MRSA infection.  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    Past Medical History:  Diagnosis Date  . Agoraphobia without history of panic disorder   . Anxiety   . Depression   . Dizziness   . Elevated serum glutamic pyruvic transaminase (SGPT) level   . Fatty liver 09/10/2015  . High triglycerides 09/10/2015  . Hypertension   . IFG (impaired fasting glucose)   . Kidney stone   . Low HDL (under 40)   . Obstructive sleep apnea 11/02/2016  . Overweight (BMI 25.0-29.9) 11/06/2014  . Snoring 09/10/2015  . Vitamin D deficiency    Family History  Problem Relation Age of Onset  . Heart disease Father        heart attack age 56  . Heart attack Father   . Hyperlipidemia Father   . Sleep apnea Father   . Colon polyps Father   . Cancer Brother        hodgins, liver cancer  . Cancer Brother    Past Surgical History:  Procedure Laterality Date  . NASAL SINUS SURGERY  2008   Patient Active Problem List   Diagnosis Date Noted  . Chronic pain of left knee 11/19/2017  . Pseudogout 11/19/2017  . Pain in joint of left hip 11/19/2017  . Pain, joint, multiple sites 11/19/2017  . DDD (degenerative disc disease), lumbar 11/19/2017  . PTSD (post-traumatic stress disorder) 10/27/2017  . Chronic midline low back pain with left-sided sciatica 10/27/2017  . Preventative health care 10/19/2017  . Obstructive  sleep apnea 11/02/2016  . High triglycerides 09/10/2015  . Fatty liver 09/10/2015  . Controlled substance agreement signed 02/13/2015  . Medication monitoring encounter 11/06/2014  . Benzodiazepine use agreement exists 11/06/2014  . Overweight (BMI 25.0-29.9) 11/06/2014  . Allergic rhinitis 11/06/2014  . Low HDL (under 40)   . Chronic anxiety 10/21/2014  . Hypertension 07/15/2014  . Depression 07/15/2014  . Agoraphobia without history of panic disorder   . Elevated serum glutamic pyruvic transaminase (SGPT) level       Prior to Admission medications   Medication Sig Start Date End Date Taking? Authorizing Provider  azelastine (ASTELIN) 0.1 % nasal spray Place 2 sprays into both nostrils 2 (two) times daily. Use in each nostril as directed 10/19/17   Kerman PasseyLada, Melinda P, MD  cholecalciferol (VITAMIN D) 1000 UNITS tablet Take 1,000 Units by mouth daily.    [provider]  LORazepam (ATIVAN) 0.5 MG tablet Take 0.5-1 tablets (0.25-0.5 mg total) by mouth 2 (two) times daily as needed for anxiety. 12/06/17   [name], Janit BernMelinda P, MD  meloxicam (MOBIC) 7.5 MG tablet TAKE 1 TABLET DAILY AS NEEDED FOR PAIN - TAKE WITH FOOD Patient taking differently: Take 7.5 mg by mouth daily.  06/18/18   Poulose, Percell BeltElizabeth E, NP  Misc. Devices (WRIST BRACE)  MISC RIGHT wrist brace with thumb splint for Dequervains; NOT carpal tunnel; wear as much as possible 10/27/17   [name], Janit Bern, MD  Multiple Vitamin (MULTIVITAMIN) tablet Take 1 tablet by mouth daily.    [provider]  sertraline (ZOLOFT) 100 MG tablet Take 1 tablet (100 mg total) by mouth daily. 02/08/18   Kerman Passey, MD  sulfamethoxazole-trimethoprim (BACTRIM DS) 800-160 MG tablet Take 1 tablet by mouth 2 (two) times daily for 5 days. 07/30/18 08/04/18  Willy Eddy, MD  vitamin B-12 (CYANOCOBALAMIN) 1000 MCG tablet Take 1,000 mcg by mouth daily.     [provider]    Allergies Augmentin [amoxicillin-pot clavulanate]     Social History Social History   Tobacco Use  . Smoking status: Former Smoker    Packs/day: 1.00    Years: 18.00    Pack years: 18.00    Types: Cigarettes    Last attempt to quit: 03/28/1990    Years since quitting: 28.3  . Smokeless tobacco: Current User    Types: Snuff  Substance Use Topics  . Alcohol use: Yes    Alcohol/week: 1.0 standard drinks    Types: 1 Cans of beer per week    Comment: occasionally  . Drug use: No    Review of Systems Patient denies headaches, rhinorrhea, blurry vision, numbness, shortness of breath, chest pain, edema, cough, abdominal pain, nausea, vomiting, diarrhea, dysuria, fevers, rashes or hallucinations unless otherwise stated above in HPI. ____________________________________________   PHYSICAL EXAM:  VITAL SIGNS: Vitals:   07/30/18 0001  BP: (!) 144/78  Pulse: 95  Resp: 18  Temp: 98.7 F (37.1 C)  SpO2: 96%    Constitutional: Alert and oriented.  Eyes: Conjunctivae are normal.  Head: Atraumatic. Nose: No congestion/rhinnorhea. Mouth/Throat: Mucous membranes are moist.   Neck: No stridor. Painless ROM.  Cardiovascular: Normal rate, regular rhythm. Grossly normal heart sounds.  Good peripheral circulation. Respiratory: Normal respiratory effort.  No retractions. Lungs CTAB. Gastrointestinal: Soft and nontender. No distention. No abdominal bruits. No CVA tenderness. Genitourinary:  Musculoskeletal: Grape size area of erythema and fluctuance on the radial dorsal aspect of the hand overlying the proximal second metacarpal.  No effusion.  There is some trace edema and streaking cellulitis.  N/V intact distally Neurologic:  Normal speech and language. No gross focal neurologic deficits are appreciated. No facial droop Skin:  Skin is warm, dry and intact. No rash noted. Psychiatric: Mood and affect are normal. Speech and behavior are normal.  ____________________________________________   LABS (all labs ordered are listed, but only  abnormal results are displayed)  Results for orders placed or performed during the hospital encounter of 07/30/18 (from the past 24 hour(s))  Basic metabolic panel     Status: Abnormal   Collection Time: 07/30/18 12:36 AM  Result Value Ref Range   Sodium 141 135 - 145 mmol/L   Potassium 3.6 3.5 - 5.1 mmol/L   Chloride 104 98 - 111 mmol/L   CO2 27 22 - 32 mmol/L   Glucose, Bld 115 (H) 70 - 99 mg/dL   BUN 15 6 - 20 mg/dL   Creatinine, Ser 0.96 0.61 - 1.24 mg/dL   Calcium 9.0 8.9 - 28.3 mg/dL   GFR calc non Af Amer >60 >60 mL/min   GFR calc Af Amer >60 >60 mL/min   Anion gap 10 5 - 15  CBC with Differential/Platelet     Status: Abnormal   Collection Time: 07/30/18 12:36 AM  Result Value Ref Range  WBC 10.8 (H) 4.0 - 10.5 K/uL   RBC 4.89 4.22 - 5.81 MIL/uL   Hemoglobin 14.2 13.0 - 17.0 g/dL   HCT 16.1 09.6 - 04.5 %   MCV 87.5 80.0 - 100.0 fL   MCH 29.0 26.0 - 34.0 pg   MCHC 33.2 30.0 - 36.0 g/dL   RDW 40.9 81.1 - 91.4 %   Platelets 177 150 - 400 K/uL   nRBC 0.0 0.0 - 0.2 %   Neutrophils Relative % 69 %   Neutro Abs 7.4 1.7 - 7.7 K/uL   Lymphocytes Relative 20 %   Lymphs Abs 2.2 0.7 - 4.0 K/uL   Monocytes Relative 8 %   Monocytes Absolute 0.9 0.1 - 1.0 K/uL   Eosinophils Relative 2 %   Eosinophils Absolute 0.2 0.0 - 0.5 K/uL   Basophils Relative 1 %   Basophils Absolute 0.1 0.0 - 0.1 K/uL   Immature Granulocytes 0 %   Abs Immature Granulocytes 0.04 0.00 - 0.07 K/uL   ____________________________________________ ____________________________________________  RADIOLOGY  EMERGENCY DEPARTMENT US SOFT TISSUE INTERPRETATION "Study: Limited Soft Tissue Ultrasound"  INDICATIONS: Soft tissue infection Multiple views of the body part were obtained in real-time with a multi-frequency linear probe  PERFORMED BY: Myself IMAGES ARCHIVED?: No SIDE:Left BODY PART:Upper extremity INTERPRETATION:  Abcess present    ____________________________________________   PROCEDURES   Procedure(s) performed:  Marland KitchenMarland KitchenIncision and Drainage Date/Time: 07/30/2018 12:51 AM Performed by: Willy Eddy, MD Authorized by: Willy Eddy, MD   Consent:    Consent obtained:  Verbal   Consent given by:  Patient   Risks discussed:  Bleeding, infection, incomplete drainage and pain   Alternatives discussed:  Alternative treatment, delayed treatment and observation Location:    Type:  Abscess   Size:  2cm   Location:  Upper extremity   Upper extremity location:  Hand   Hand location:  L hand Pre-procedure details:    Skin preparation:  Betadine Anesthesia (see MAR for exact dosages):    Anesthesia method:  Local infiltration   Local anesthetic:  Lidocaine 1% w/o epi Procedure type:    Complexity:  Simple Procedure details:    Incision types:  Stab incision   Scalpel blade:  11   Drainage:  Purulent   Drainage amount:  Moderate   Wound treatment:  Wound left open   Packing materials:  None Post-procedure details:    Patient tolerance of procedure:  Tolerated well, no immediate complications      Critical Care performed: no ____________________________________________   INITIAL IMPRESSION / ASSESSMENT AND PLAN / ED COURSE  Pertinent labs & imaging results that were available during my care of the patient were reviewed by me and considered in my medical decision making (see chart for details).   DDX: abscess, cellulitis, septic arthritis  Richard Marcella. is a 58 y.o. who presents to the ED with with 2cm abscess on nondominant hand for last several days. No systemic symptoms, no fevers. VSS. Exam with small amount of surrounding erythema, c/w cellulitis.  Clinical picture is not consistent with necrotizing fasc or osteomyelitis.  No evidence of extensor tendon involvement.  I & D of abscess done w/o complications. Area explored, drained as above. Start ABX for small area of cellulitis surrounding abscess. Plan f/u for wound recheck in 2 days.  Patient ambulated  without distress and tolerated PO. Patient stable for DC home with plan for close PCP follow up.     The patient was evaluated in Emergency Department today  for the symptoms described in the history of present illness. He/she was evaluated in the context of the global COVID-19 pandemic, which necessitated consideration that the patient might be at risk for infection with the SARS-CoV-2 virus that causes COVID-19. Institutional protocols and algorithms that pertain to the evaluation of patients at risk for COVID-19 are in a state of rapid change based on information released by regulatory bodies including the CDC and federal and state organizations. These policies and algorithms were followed during the patient's care in the ED.   As part of my medical decision making, I reviewed the following data within the electronic MEDICAL RECORD NUMBER Nursing notes reviewed and incorporated, Labs reviewed, notes from prior ED visits and [place] Controlled Substance Database   ____________________________________________   FINAL CLINICAL IMPRESSION(S) / ED DIAGNOSES  Final diagnoses:  Abscess      NEW MEDICATIONS STARTED DURING THIS VISIT:  New Prescriptions   SULFAMETHOXAZOLE-TRIMETHOPRIM (BACTRIM DS) 800-160 MG TABLET    Take 1 tablet by mouth 2 (two) times daily for 5 days.     Note:  This document was prepared using Dragon voice recognition software and may include unintentional dictation errors.    Willy Eddy, MD 07/30/18 [phone]

## 2018-07-30 NOTE — Discharge Instructions (Addendum)
Please return should he develop any worsening pain, fevers, redness or for any additional questions or concerns.  Please keep incision clean dry.  You can Place Neosporin or triple antibiotic ointment on it as well.  I have given you a referral to Ortho who has a hand specialist if you have any difficulties or issues with persistent infection or worsening pain.

## 2018-07-30 NOTE — ED Notes (Signed)
MD in with patient

## 2018-10-12 ENCOUNTER — Other Ambulatory Visit: Payer: Self-pay | Admitting: Family Medicine

## 2018-10-12 DIAGNOSIS — G8929 Other chronic pain: Secondary | ICD-10-CM

## 2018-10-12 NOTE — Telephone Encounter (Signed)
Patient needs routine appointment within the month

## 2018-10-16 NOTE — Telephone Encounter (Signed)
lvm to sch appt °

## 2018-11-12 ENCOUNTER — Emergency Department
Admission: EM | Admit: 2018-11-12 | Discharge: 2018-11-12 | Disposition: A | Discharge status: Home / Self Care | Payer: PRIVATE HEALTH INSURANCE | Attending: Emergency Medicine | Admitting: Emergency Medicine

## 2018-11-12 ENCOUNTER — Encounter: Payer: Self-pay | Admitting: Psychiatry

## 2018-11-12 ENCOUNTER — Emergency Department: Payer: PRIVATE HEALTH INSURANCE

## 2018-11-12 DIAGNOSIS — Y990 Civilian activity done for income or pay: Secondary | ICD-10-CM | POA: Insufficient documentation

## 2018-11-12 DIAGNOSIS — Z87891 Personal history of nicotine dependence: Secondary | ICD-10-CM | POA: Insufficient documentation

## 2018-11-12 DIAGNOSIS — I10 Essential (primary) hypertension: Secondary | ICD-10-CM | POA: Insufficient documentation

## 2018-11-12 DIAGNOSIS — S0990XA Unspecified injury of head, initial encounter: Secondary | ICD-10-CM | POA: Insufficient documentation

## 2018-11-12 DIAGNOSIS — Y929 Unspecified place or not applicable: Secondary | ICD-10-CM | POA: Diagnosis not present

## 2018-11-12 DIAGNOSIS — Y939 Activity, unspecified: Secondary | ICD-10-CM | POA: Insufficient documentation

## 2018-11-12 DIAGNOSIS — Z79899 Other long term (current) drug therapy: Secondary | ICD-10-CM | POA: Diagnosis not present

## 2018-11-12 MED ORDER — ACETAMINOPHEN 500 MG PO TABS
1000.0000 mg | ORAL_TABLET | Freq: Once | ORAL | Status: AC
  Administered 2018-11-12: 1000 mg via ORAL
  Filled 2018-11-12: qty 2

## 2018-11-12 NOTE — ED Notes (Signed)
Patient taken to imaging at this time.
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
## 2018-11-12 NOTE — Progress Notes (Signed)
Patient educated on discharge instructions and given all discharge paperwork. Pt. Verbalizes understanding of recommendations and discharge instructions. Will discharge at this time.

## 2018-11-12 NOTE — Discharge Instructions (Signed)
Advil or Tylenol for pain please return for any increasing headache nausea vomiting blurry vision or any other complaints.

## 2018-11-22 ENCOUNTER — Other Ambulatory Visit: Payer: Self-pay | Admitting: Family Medicine

## 2018-11-22 DIAGNOSIS — G8929 Other chronic pain: Secondary | ICD-10-CM

## 2018-11-22 DIAGNOSIS — M5442 Lumbago with sciatica, left side: Secondary | ICD-10-CM

## 2018-11-22 NOTE — Telephone Encounter (Signed)
Medication Refill - Medication:  meloxicam (MOBIC) 7.5 MG tablet sertraline (ZOLOFT) 100 MG tablet  azelastine (ASTELIN) 0.1 % nasal spray   Has the patient contacted their pharmacy? Yes advised to call.   Preferred Pharmacy (with phone number or street name):  [place], [place] [phone] (Phone) [phone] (Fax)   Agent: Please be advised that RX refills may take up to 3 business days. We ask that you follow-up with your pharmacy.

## 2018-11-23 MED ORDER — SERTRALINE HCL 100 MG PO TABS: 100.0000 mg | ORAL_TABLET | Freq: Every day | ORAL | 0 refills | Status: DC

## 2018-11-23 NOTE — Telephone Encounter (Signed)
needs virtual appointment within one month

## 2018-11-29 ENCOUNTER — Telehealth: Payer: Self-pay | Admitting: Family Medicine

## 2018-11-30 NOTE — Telephone Encounter (Signed)
Please schedule patient for follow up in the next 30 days.  

## 2018-12-04 NOTE — Telephone Encounter (Signed)
lvm for pt to call and schedule an appt °

## 2019-02-01 ENCOUNTER — Other Ambulatory Visit: Payer: Self-pay

## 2019-02-01 ENCOUNTER — Encounter: Payer: Self-pay | Admitting: Family Medicine

## 2019-02-01 ENCOUNTER — Ambulatory Visit: Payer: Self-pay | Admitting: *Deleted

## 2019-02-01 ENCOUNTER — Encounter: Payer: Self-pay | Admitting: *Deleted

## 2019-02-01 ENCOUNTER — Ambulatory Visit (INDEPENDENT_AMBULATORY_CARE_PROVIDER_SITE_OTHER): Payer: 59 | Admitting: Family Medicine

## 2019-02-01 DIAGNOSIS — E781 Pure hyperglyceridemia: Secondary | ICD-10-CM | POA: Diagnosis not present

## 2019-02-01 DIAGNOSIS — F4002 Agoraphobia without panic disorder: Secondary | ICD-10-CM

## 2019-02-01 DIAGNOSIS — G4733 Obstructive sleep apnea (adult) (pediatric): Secondary | ICD-10-CM | POA: Diagnosis not present

## 2019-02-01 DIAGNOSIS — F4310 Post-traumatic stress disorder, unspecified: Secondary | ICD-10-CM

## 2019-02-01 DIAGNOSIS — M2550 Pain in unspecified joint: Secondary | ICD-10-CM

## 2019-02-01 DIAGNOSIS — M25552 Pain in left hip: Secondary | ICD-10-CM

## 2019-02-01 DIAGNOSIS — J309 Allergic rhinitis, unspecified: Secondary | ICD-10-CM | POA: Diagnosis not present

## 2019-02-01 DIAGNOSIS — F419 Anxiety disorder, unspecified: Secondary | ICD-10-CM

## 2019-02-01 DIAGNOSIS — M5442 Lumbago with sciatica, left side: Secondary | ICD-10-CM

## 2019-02-01 DIAGNOSIS — G8929 Other chronic pain: Secondary | ICD-10-CM

## 2019-02-01 DIAGNOSIS — F330 Major depressive disorder, recurrent, mild: Secondary | ICD-10-CM

## 2019-02-01 DIAGNOSIS — E786 Lipoprotein deficiency: Secondary | ICD-10-CM | POA: Diagnosis not present

## 2019-02-01 DIAGNOSIS — M5136 Other intervertebral disc degeneration, lumbar region: Secondary | ICD-10-CM

## 2019-02-01 MED ORDER — MELOXICAM 7.5 MG PO TABS: ORAL_TABLET | ORAL | 1 refills | Status: DC

## 2019-02-01 MED ORDER — AZELASTINE HCL 0.1 % NA SOLN: 2.0000 | Freq: Two times a day (BID) | NASAL | 11 refills | Status: DC
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
## 2019-02-01 NOTE — Chronic Care Management (AMB) (Signed)
    Care Management   Unsuccessful Call Note 02/01/2019 Name: Richard [name]. MRN: [id number] DOB: [birth year]  Patient  is a 58  year old male who sees Raelyn Ensign, FNP for primary care. Raelyn Ensign, FNP asked the CCM team to consult the patient for Mental Health Counseling referrals.  Referral was placed 02/01/19. Patient's last office visit was 02/01/19.     This social  worker was unable to reach patient via telephone today to provide mental health resources. I have left HIPAA compliant voicemail asking patient to return my call. (unsuccessful outreach #1).   Plan: Will follow-up within 7 business days via telephone.     Elliot Gurney, [place] [facility] Care Management [phone]
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
## 2019-02-04 ENCOUNTER — Ambulatory Visit: Payer: Self-pay | Admitting: *Deleted

## 2019-02-04 DIAGNOSIS — F419 Anxiety disorder, unspecified: Secondary | ICD-10-CM

## 2019-02-04 DIAGNOSIS — F4310 Post-traumatic stress disorder, unspecified: Secondary | ICD-10-CM
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
## 2019-02-06 ENCOUNTER — Other Ambulatory Visit: Payer: Self-pay | Admitting: Family Medicine

## 2019-02-07 ENCOUNTER — Ambulatory Visit: Payer: Self-pay | Admitting: *Deleted

## 2019-02-07 NOTE — Chronic Care Management (AMB) (Signed)
   Care Management   Social Work Note  02/07/2019 Name: Richard [name]. MRN: [id number] DOB: Aug 10, 1960  Richard [name]. is a 58 y.o. year old male who sees [name], [name], MD for primary care. The CCM team was consulted for assistance with Mental Health Counseling and Resources.   Phone call from the [facility] stating that have been unable to reach patient to schedule the initial intake  Appointment. CCM social worker suggested calling late afternoon as he works nights.  Outpatient Encounter Medications as of 02/07/2019  Medication Sig  . ALLERGY RELIEF 180 MG tablet Take 1 tablet (180 mg total) by mouth daily as needed for allergies or rhinitis.  [name] azelastine (ASTELIN) 0.1 % nasal spray Place 2 sprays into both nostrils 2 (two) times daily. Use in each nostril as directed  . cholecalciferol (VITAMIN D) 1000 UNITS tablet Take 1,000 Units by mouth daily.  . meloxicam (MOBIC) 7.5 MG tablet TAKE 1 TABLET DAILY AS NEEDED FOR PAIN - TAKE WITH FOOD  . Multiple Vitamin (MULTIVITAMIN) tablet Take 1 tablet by mouth daily.  . sertraline (ZOLOFT) 100 MG tablet Take 1 tablet (100 mg total) by mouth daily.  . vitamin B-12 (CYANOCOBALAMIN) 1000 MCG tablet Take 1,000 mcg by mouth daily.    No facility-administered encounter medications on file as of 02/07/2019.     Goals Addressed   None     Follow Up Plan: SW will follow up with patient by phone over the next 2 weeks regarding mental health referral  Elliot Gurney, [place] Worker  [place] Care Management [phone]

## 2019-02-14 ENCOUNTER — Ambulatory Visit: Payer: Self-pay | Admitting: *Deleted

## 2019-02-14 NOTE — Chronic Care Management (AMB) (Signed)
   Care Management   Social Work Note  02/14/2019 Name: Richard [name]. MRN: [id number] DOB: [birth year]  Richard [name]. is a 58 y.o. year old male who sees [name], [name], MD for primary care. The CCM team was consulted for assistance with Mental Health Counseling and Resources.   Follow up e-mail received from [facility] stating that patient has his initial intake appointment on 02/14/19 at 4:30pm with therapist [facility].   Outpatient Encounter Medications as of 02/14/2019  Medication Sig  . ALLERGY RELIEF 180 MG tablet Take 1 tablet (180 mg total) by mouth daily as needed for allergies or rhinitis.  [name] azelastine (ASTELIN) 0.1 % nasal spray Place 2 sprays into both nostrils 2 (two) times daily. Use in each nostril as directed  . cholecalciferol (VITAMIN D) 1000 UNITS tablet Take 1,000 Units by mouth daily.  . meloxicam (MOBIC) 7.5 MG tablet TAKE 1 TABLET DAILY AS NEEDED FOR PAIN - TAKE WITH FOOD  . Multiple Vitamin (MULTIVITAMIN) tablet Take 1 tablet by mouth daily.  . sertraline (ZOLOFT) 100 MG tablet Take 1 tablet (100 mg total) by mouth daily.  . vitamin B-12 (CYANOCOBALAMIN) 1000 MCG tablet Take 1,000 mcg by mouth daily.    No facility-administered encounter medications on file as of 02/14/2019.     Goals Addressed   None     Follow Up Plan: Appointment scheduled for SW follow up with client by phone on: 02/18/19 to follow up on mental health referral  Elliot Gurney, [place] Worker  [facility] Care Management [phone]

## 2019-02-18 ENCOUNTER — Ambulatory Visit: Payer: Self-pay | Admitting: *Deleted

## 2019-02-18 ENCOUNTER — Telehealth: Payer: Self-pay

## 2019-02-18 NOTE — Chronic Care Management (AMB) (Signed)
    Care Management   Unsuccessful Call Note 02/18/2019 Name: Richard [name]. MRN: [id number] DOB: 1961/01/14  Patient  is a 58 year old male who sees Raelyn Ensign, FNP for primary care. Raelyn Ensign, FNP asked the CCM team to consult the patient for Mental Health Counseling and Resources.     This social worker was unable to reach patient via telephone today to follow up on mental health resources provided. I have left HIPAA compliant voicemail asking patient to return my call. (unsuccessful outreach #1).   Plan: Will follow-up within 7 business days via telephone.     Elliot Gurney, [place] Administrator, [facility] Management [phone]

## 2019-02-25 ENCOUNTER — Ambulatory Visit: Payer: Self-pay | Admitting: *Deleted

## 2019-02-25 DIAGNOSIS — F4310 Post-traumatic stress disorder, unspecified: Secondary | ICD-10-CM

## 2019-02-25 DIAGNOSIS — F419 Anxiety disorder, unspecified: Secondary | ICD-10-CM

## 2019-02-26 ENCOUNTER — Telehealth: Payer: Self-pay

## 2019-02-26 NOTE — Patient Instructions (Signed)
Thank you allowing the Chronic Care Management Team to be a part of your care! It was a pleasure speaking with you today!  1. Please call this social worker with any questions or concerns regarding your mental health needs.  CCM (Chronic Care Management) Team   Neldon Labella  RN, BSN Nurse Care Coordinator  [phone]  Ruben Reason PharmD  Clinical Pharmacist  [phone]   Elliot Gurney, LCSW Clinical Social Worker [phone]  Goals Addressed            This Visit's Progress   . 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   [name] Suicidal Ideation/Homicidal Ideation: No  Clinical Social Work Goal(s):  [name] Over the next 90 days, patient will follow up with a mental health provider that specializes in PTSD* as directed by SW  Interventions: . Discussed recommendation to follow up with a therapist that specializes in trauma . Confirmed that patient had a scheduled appointment with the [facility] however was not able to work the zoom . Confirmed that patient has contacted the [facility] to re-schedule . Collaborated with the [facility] and confirmed that patient did miss his appointment and had been called twice to re-schedule but has not returned the call . Confirmed that patient will needed to follow up with an alternate provider as the therapist assigned is no longer available . Discussed plans with patient for ongoing care management follow up and provided patient with direct contact information for care management team   Patient Self Care Activities:  . Performs ADL's independently . Performs IADL's independently . Calls provider office for new concerns or questions . Ability for insight . Motivation for treatment  Patient Coping Strengths:  . Supportive Relationships . Friends . Church . Able to Communicate  Effectively  Patient Self Care Deficits:  [name] Knowledge deficit of local providers that specialize in trauma  Please see past updates related to this goal by clicking on the "Past Updates" button in the selected goal          The patient verbalized understanding of instructions provided today and declined a print copy of patient instruction materials.   Telephone follow up appointment with care management team member scheduled for: 02/02/19

## 2019-02-26 NOTE — Chronic Care Management (AMB) (Signed)
Care Management    Clinical Social Work Follow Up Note  02/26/2019 Name: Richard [name]. MRN: [id number] DOB: 30-Mar-1960  Richard [name]. is a 58 y.o. year old male who is a primary care patient of [name], [name], MD. The CCM team was consulted for assistance with Mental Health Counseling and Resources.   Review of patient status, including review of consultants reports, other relevant assessments, and collaboration with appropriate care team members and the patient's provider was performed as part of comprehensive patient evaluation and provision of chronic care management services.    Advanced Directives Status: <no information> See Care Plan for related entries.   Outpatient Encounter Medications as of 02/25/2019  Medication Sig  . ALLERGY RELIEF 180 MG tablet Take 1 tablet (180 mg total) by mouth daily as needed for allergies or rhinitis.  Richard [name] azelastine (ASTELIN) 0.1 % nasal spray Place 2 sprays into both nostrils 2 (two) times daily. Use in each nostril as directed  . cholecalciferol (VITAMIN D) 1000 UNITS tablet Take 1,000 Units by mouth daily.  . meloxicam (MOBIC) 7.5 MG tablet TAKE 1 TABLET DAILY AS NEEDED FOR PAIN - TAKE WITH FOOD  . Multiple Vitamin (MULTIVITAMIN) tablet Take 1 tablet by mouth daily.  . sertraline (ZOLOFT) 100 MG tablet Take 1 tablet (100 mg total) by mouth daily.  . vitamin B-12 (CYANOCOBALAMIN) 1000 MCG tablet Take 1,000 mcg by mouth daily.    No facility-administered encounter medications on file as of 02/25/2019.      Goals Addressed            This Visit's Progress   . 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   Richard [name] Suicidal Ideation/Homicidal Ideation: No  Clinical Social Work Goal(s):  Richard [name] Over the next 90 days, patient will follow up with a mental health provider that specializes in PTSD* as directed by SW   Interventions: . Discussed recommendation to follow up with a therapist that specializes in trauma . Confirmed that patient had a scheduled appointment with the [facility] however was not able to work the zoom . Confirmed that patient has contacted the [facility] to re-schedule . Collaborated with the [facility] and confirmed that patient did miss his appointment and had been called twice to re-schedule but has not returned the call . Confirmed that patient will needed to follow up with an alternate provider as the therapist assigned is no longer available . Discussed plans with patient for ongoing care management follow up and provided patient with direct contact information for care management team   Patient Self Care Activities:  . Performs ADL's independently . Performs IADL's independently . Calls provider office for new concerns or questions . Ability for insight . Motivation for treatment  Patient Coping Strengths:  . Supportive Relationships . Friends . Church . Able to Communicate Effectively  Patient Self Care Deficits:  Richard [name] Knowledge deficit of local providers that specialize in trauma  Please see past updates related to this goal by clicking on the "Past Updates" button in the selected goal          Follow Up Plan: SW will follow up with patient by phone over the next 2 weeks   [place], [place] Worker  [place] [facility] Care [facility] [phone]

## 2019-03-04 ENCOUNTER — Telehealth: Payer: Self-pay

## 2019-03-04 ENCOUNTER — Ambulatory Visit: Payer: Self-pay | Admitting: *Deleted

## 2019-03-04 NOTE — Chronic Care Management (AMB) (Signed)
    Care Management   Unsuccessful Call Note 03/04/2019 Name: Richard [name]. MRN: [id number] DOB: [birth year]  Patient is a 58 year old male  who sees Raelyn Ensign, FNP for primary care. Raelyn Ensign, FNP asked the CCM team to consult the patient for Mental Health counseling and Resources.      This social worker was unable to reach patient via telephone today to provide additional resources for mental health follow up using a sliding scale including [place] [phone], [place] [phone] and [facility] [phone] . I have left HIPAA compliant voicemail asking patient to return my call. (unsuccessful outreach #1).   Plan: Will follow-up within 7 business days via telephone.     Elliot Gurney, [place] Administrator, [facility] Care Management [phone]

## 2019-03-06 ENCOUNTER — Ambulatory Visit: Payer: Self-pay | Admitting: *Deleted

## 2019-03-06 DIAGNOSIS — F4310 Post-traumatic stress disorder, unspecified: Secondary | ICD-10-CM

## 2019-03-06 DIAGNOSIS — F419 Anxiety disorder, unspecified: Secondary | ICD-10-CM

## 2019-03-06 NOTE — Chronic Care Management (AMB) (Signed)
   Care Management    Clinical Social Work Follow Up Note  03/06/2019 Name: Richard [name]. MRN: [id number] DOB: 08/07/1960  Richard [name]. is a 58 y.o. year old male who is a primary care patient of [name], [name], MD. The CCM team was consulted for assistance with Mental Health Counseling and Resources.   Review of patient status, including review of consultants reports, other relevant assessments, and collaboration with appropriate care team members and the patient's provider was performed as part of comprehensive patient evaluation and provision of chronic care management services.    Advanced Directives Status: <no information> See Care Plan for related entries.   Outpatient Encounter Medications as of 03/06/2019  Medication Sig  . ALLERGY RELIEF 180 MG tablet Take 1 tablet (180 mg total) by mouth daily as needed for allergies or rhinitis.  [name] azelastine (ASTELIN) 0.1 % nasal spray Place 2 sprays into both nostrils 2 (two) times daily. Use in each nostril as directed  . cholecalciferol (VITAMIN D) 1000 UNITS tablet Take 1,000 Units by mouth daily.  . meloxicam (MOBIC) 7.5 MG tablet TAKE 1 TABLET DAILY AS NEEDED FOR PAIN - TAKE WITH FOOD  . Multiple Vitamin (MULTIVITAMIN) tablet Take 1 tablet by mouth daily.  . sertraline (ZOLOFT) 100 MG tablet Take 1 tablet (100 mg total) by mouth daily.  . vitamin B-12 (CYANOCOBALAMIN) 1000 MCG tablet Take 1,000 mcg by mouth daily.    No facility-administered encounter medications on file as of 03/06/2019.      Goals Addressed            This Visit's Progress   . 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   [name] Suicidal Ideation/Homicidal Ideation: No  Clinical Social Work Goal(s):  [name] Over the next 90 days, patient will follow up with a mental health provider that specializes in PTSD* as directed by SW   Interventions: . Continued to discuss recommendation to follow up with a therapist that specializes in trauma . Confirmed that therapist  through the [facility] is no longer available and alternate plans will need to be made . Encouraged patient to contact his insurance company for [employer] . Discussed the possibility of paying a private agency by sliding scale for services if needed . Discussed plans with patient for ongoing care management follow up and provided patient with direct contact information for care management team   Patient Self Care Activities:  . Performs ADL's independently . Performs IADL's independently . Calls provider office for new concerns or questions . Ability for insight . Motivation for treatment  Patient Coping Strengths:  . Supportive Relationships . Friends . Church . Able to Communicate Effectively  Patient Self Care Deficits:  [name] Knowledge deficit of local providers that specialize in trauma  Please see past updates related to this goal by clicking on the "Past Updates" button in the selected goal          Follow Up Plan: SW will follow up with patient by phone over the next 2-3 weeks   [place], [place] [facility] Care Management [phone]

## 2019-03-06 NOTE — Patient Instructions (Signed)
Thank you allowing the Chronic Care Management Team to be a part of your care! It was a pleasure speaking with you today!  1. Please call your insurance company for list of in-network mental health providers.   CCM (Chronic Care Management) Team   Neldon Labella  RN, BSN Nurse Care Coordinator  [phone]  Ruben Reason PharmD  Clinical Pharmacist  [phone]   Elliot Gurney, LCSW Clinical Social Worker [phone]  Goals Addressed            This Visit's Progress   . 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   [name] Suicidal Ideation/Homicidal Ideation: No  Clinical Social Work Goal(s):  [name] Over the next 90 days, patient will follow up with a mental health provider that specializes in PTSD* as directed by SW  Interventions: . Continued to discuss recommendation to follow up with a therapist that specializes in trauma . Confirmed that therapist  through the [facility] is no longer available and alternate plans will need to be made . Encouraged patient to contact his insurance company for [employer] . Discussed the possibility of paying a private agency by sliding scale for services if needed . Discussed plans with patient for ongoing care management follow up and provided patient with direct contact information for care management team   Patient Self Care Activities:  . Performs ADL's independently . Performs IADL's independently . Calls provider office for new concerns or questions . Ability for insight . Motivation for treatment  Patient Coping Strengths:  . Supportive Relationships . Friends . Church . Able to Communicate Effectively  Patient Self Care Deficits:  [name] Knowledge deficit of local providers that specialize in trauma  Please see past updates related to this goal by clicking on the "Past  Updates" button in the selected goal          The patient verbalized understanding of instructions provided today and declined a print copy of patient instruction materials.   Telephone follow up appointment with care management team member scheduled for: 03/25/19

## 2019-03-11 ENCOUNTER — Telehealth: Payer: Self-pay

## 2019-03-19 ENCOUNTER — Other Ambulatory Visit: Payer: Self-pay

## 2019-03-19 ENCOUNTER — Other Ambulatory Visit (HOSPITAL_COMMUNITY)
Admission: RE | Admit: 2019-03-19 | Discharge: 2019-03-19 | Disposition: A | Discharge status: Home / Self Care | Payer: No Typology Code available for payment source | Source: Ambulatory Visit | Attending: Family Medicine | Admitting: Family Medicine

## 2019-03-19 ENCOUNTER — Encounter: Payer: Self-pay | Admitting: Family Medicine

## 2019-03-19 ENCOUNTER — Ambulatory Visit (INDEPENDENT_AMBULATORY_CARE_PROVIDER_SITE_OTHER): Payer: No Typology Code available for payment source | Admitting: Family Medicine

## 2019-03-19 VITALS — BP 124/72 | HR 86 | Temp 97.9°F | Resp 16 | Ht 76.0 in | Wt 244.3 lb

## 2019-03-19 DIAGNOSIS — Z113 Encounter for screening for infections with a predominantly sexual mode of transmission: Secondary | ICD-10-CM | POA: Insufficient documentation

## 2019-03-19 DIAGNOSIS — F4310 Post-traumatic stress disorder, unspecified: Secondary | ICD-10-CM

## 2019-03-19 DIAGNOSIS — Z1211 Encounter for screening for malignant neoplasm of colon: Secondary | ICD-10-CM | POA: Diagnosis not present

## 2019-03-19 DIAGNOSIS — F419 Anxiety disorder, unspecified: Secondary | ICD-10-CM | POA: Diagnosis not present

## 2019-03-19 DIAGNOSIS — Z1283 Encounter for screening for malignant neoplasm of skin: Secondary | ICD-10-CM

## 2019-03-19 DIAGNOSIS — N539 Unspecified male sexual dysfunction: Secondary | ICD-10-CM

## 2019-03-19 DIAGNOSIS — E786 Lipoprotein deficiency: Secondary | ICD-10-CM

## 2019-03-19 DIAGNOSIS — E663 Overweight: Secondary | ICD-10-CM

## 2019-03-19 DIAGNOSIS — Z0000 Encounter for general adult medical examination without abnormal findings: Secondary | ICD-10-CM | POA: Insufficient documentation

## 2019-03-19 DIAGNOSIS — Z125 Encounter for screening for malignant neoplasm of prostate: Secondary | ICD-10-CM

## 2019-03-19 MED ORDER — TADALAFIL 20 MG PO TABS: 10.0000 mg | ORAL_TABLET | ORAL | 3 refills | Status: DC | PRN

## 2019-03-19 MED ORDER — BUSPIRONE HCL 7.5 MG PO TABS: 7.5000 mg | ORAL_TABLET | Freq: Every day | ORAL | 0 refills | Status: DC

## 2019-03-19 NOTE — Patient Instructions (Addendum)
Preventive Care 41-56 Years Old, Male Preventive care refers to lifestyle choices and visits with your health care provider that can promote health and wellness. This includes:  A yearly physical exam. This is also called an annual well check.  Regular dental and eye exams.  Immunizations.  Screening for certain conditions.  Healthy lifestyle choices, such as eating a healthy diet, getting regular exercise, not using drugs or products that contain nicotine and tobacco, and limiting alcohol use. What can I expect for my preventive care visit? Physical exam Your health care provider will check:  Height and weight. These may be used to calculate body mass index (BMI), which is a measurement that tells if you are at a healthy weight.  Heart rate and blood pressure.  Your skin for abnormal spots. Counseling Your health care provider may ask you questions about:  Alcohol, tobacco, and drug use.  Emotional well-being.  Home and relationship well-being.  Sexual activity.  Eating habits.  Work and work Statistician. What immunizations do I need?  Influenza (flu) vaccine  This is recommended every year. Tetanus, diphtheria, and pertussis (Tdap) vaccine  You may need a Td booster every 10 years. Varicella (chickenpox) vaccine  You may need this vaccine if you have not already been vaccinated. Zoster (shingles) vaccine  You may need this after age 64. Measles, mumps, and rubella (MMR) vaccine  You may need at least one dose of MMR if you were born in 1957 or later. You may also need a second dose. Pneumococcal conjugate (PCV13) vaccine  You may need this if you have certain conditions and were not previously vaccinated. Pneumococcal polysaccharide (PPSV23) vaccine  You may need one or two doses if you smoke cigarettes or if you have certain conditions. Meningococcal conjugate (MenACWY) vaccine  You may need this if you have certain conditions. Hepatitis A  vaccine  You may need this if you have certain conditions or if you travel or work in places where you may be exposed to hepatitis A. Hepatitis B vaccine  You may need this if you have certain conditions or if you travel or work in places where you may be exposed to hepatitis B. Haemophilus influenzae type b (Hib) vaccine  You may need this if you have certain risk factors. Human papillomavirus (HPV) vaccine  If recommended by your health care provider, you may need three doses over 6 months. You may receive vaccines as individual doses or as more than one vaccine together in one shot (combination vaccines). Talk with your health care provider about the risks and benefits of combination vaccines. What tests do I need? Blood tests  Lipid and cholesterol levels. These may be checked every 5 years, or more frequently if you are over 60 years old.  Hepatitis C test.  Hepatitis B test. Screening  Lung cancer screening. You may have this screening every year starting at age 43 if you have a 30-pack-year history of smoking and currently smoke or have quit within the past 15 years.  Prostate cancer screening. Recommendations will vary depending on your family history and other risks.  Colorectal cancer screening. All adults should have this screening starting at age 72 and continuing until age 2. Your health care provider may recommend screening at age 14 if you are at increased risk. You will have tests every 1-10 years, depending on your results and the type of screening test.  Diabetes screening. This is done by checking your blood sugar (glucose) after you have not eaten  for a while (fasting). You may have this done every 1-3 years.  Sexually transmitted disease (STD) testing. Follow these instructions at home: Eating and drinking  Eat a diet that includes fresh fruits and vegetables, whole grains, lean protein, and low-fat dairy products.  Take vitamin and mineral supplements as  recommended by your health care provider.  Do not drink alcohol if your health care provider tells you not to drink.  If you drink alcohol: ? Limit how much you have to 0-2 drinks a day. ? Be aware of how much alcohol is in your drink. In the [place] one drink equals one 12 oz bottle of beer (355 mL), one 5 oz glass of wine (148 mL), or one 1 oz glass of hard liquor (44 mL). Lifestyle  Take daily care of your teeth and gums.  Stay active. Exercise for at least 30 minutes on 5 or more days each week.  Do not use any products that contain nicotine or tobacco, such as cigarettes, e-cigarettes, and chewing tobacco. If you need help quitting, ask your health care provider.  If you are sexually active, practice safe sex. Use a condom or other form of protection to prevent STIs (sexually transmitted infections).  Talk with your health care provider about taking a low-dose aspirin every day starting at age 50. What's next?  Go to your health care provider once a year for a well check visit.  Ask your health care provider how often you should have your eyes and teeth checked.  Stay up to date on all vaccines. This information is not intended to replace advice given to you by your health care provider. Make sure you discuss any questions you have with your health care provider. Document Released: 04/10/2015 Document Revised: 03/08/2018 Document Reviewed: 03/08/2018 Elsevier Patient Education  2020 [employer]   

## 2019-03-19 NOTE — Progress Notes (Signed)
Name: Richard [name].   MRN: [id number]    DOB: [birth year]   Date:03/19/2019       Progress Note  Subjective  Chief Complaint  Chief Complaint  Patient presents with  . Annual Exam    HPI  Patient presents for annual CPE.  USPSTF grade A and B recommendations:  Diet: Balanced Exercise: He does a lot of walking  Depression: phq 9 is positive.  He is still not sleeping well - having nightmares still.  He is taking Zoloft 100mg .  Discussed trazodone and buspirone - would like to try buspirone today. Depression screen [facility] 2/9 03/19/2019 02/01/2019 02/01/2019 11/17/2017 10/19/2017  Decreased Interest 1 1 1  0 0  Down, Depressed, Hopeless 1 1 1  0 3  PHQ - 2 Score 2 2 2  0 3  Altered sleeping 1 1 1 1 1   Tired, decreased energy 1 1 1 2 3   Change in appetite 0 0 0 0 3  Feeling bad or failure about yourself  0 0 0 0 0  Trouble concentrating 0 0 0 0 1  Moving slowly or fidgety/restless 0 0 0 0 0  Suicidal thoughts 0 0 0 0 0  PHQ-9 Score 4 4 4 3 11   Difficult doing work/chores - Not difficult at all Not difficult at all Not difficult at all Somewhat difficult    Hypertension:  BP Readings from Last 3 Encounters:  03/19/19 124/72  11/12/18 123/86  07/30/18 104/68    Obesity: Wt Readings from Last 3 Encounters:  03/19/19 244 lb 4.8 oz (110.8 kg)  11/12/18 230 lb (104.3 kg)  07/30/18 225 lb (102.1 kg)   BMI Readings from Last 3 Encounters:  03/19/19 29.74 kg/m  11/12/18 28.75 kg/m  07/30/18 27.39 kg/m     Lipids:  Lab Results  Component Value Date   CHOL 161 11/02/2016   CHOL 184 05/04/2016   CHOL 145 06/17/2015   Lab Results  Component Value Date   HDL 43 11/02/2016   HDL 45 05/04/2016   HDL 28 (L) 06/17/2015   Lab Results  Component Value Date   LDLCALC 97 11/02/2016   LDLCALC 113 (H) 05/04/2016   LDLCALC 55 06/17/2015   Lab Results  Component Value Date   TRIG 105 11/02/2016   TRIG 128 05/04/2016   TRIG 310 (H) 06/17/2015   Lab Results  Component  Value Date   CHOLHDL 3.7 11/02/2016   CHOLHDL 4.1 05/04/2016   No results found for: LDLDIRECT Glucose:  Glucose, Bld  Date Value Ref Range Status  07/30/2018 115 (H) 70 - 99 mg/dL Final  06/19/2015 88 70 - 99 mg/dL Final  01/02/2017 96 65 - 99 mg/dL Final   Glucose-Capillary  Date Value Ref Range Status  10/03/2015 121 (H) 65 - 99 mg/dL Final      Office Visit from 03/19/2019 in [facility]  AUDIT-C Score  1    Very rarely  Divorced STD testing and prevention (HIV/chl/gon/syphilis): 1 new partner in the last year.  We will do STI screening today Hep C: Negative in 2017  Skin cancer: Discussed monitoring for atypical lesions. Does have a lot of moles, notes dad and grandfather both had skin cancer (melanoma).  We will refer to dermatology for skin survey. Colorectal cancer: Dad  Father had 8 polyps removed last year at 56yo - one polyp had stage 1 colon cancer. No personal history of colorectal cancer, no changes in BM's - no blood in stool, dark and tarry  stool, mucus in stool, or constipation/diarrhea. We will refer for colonoscopy today. Has had negative Cologuard in the past but is overdue.   Prostate cancer: No family history.  He has been struggling with achieving orgasm, does not have low libido.  We discussed interplay of his complex PTSD and sexual dysfunction.  We also discussed cialis and viagra.  He would like to try Cialis  - discussed alerting EMS/ER providers if he has taken this medication and is having MI symptoms to avoid Nitroglycerin.  No results found for: PSA  IPSS Questionnaire (AUA-7): Over the past month.   1)  How often have you had a sensation of not emptying your bladder completely after you finish urinating?  0 - Not at all  2)  How often have you had to urinate again less than two hours after you finished urinating? 2 - Less than half the time  3)  How often have you found you stopped and started again several times when you  urinated?  0 - Not at all  4) How difficult have you found it to postpone urination?  0 - Not at all  5) How often have you had a weak urinary stream?  0 - Not at all  6) How often have you had to push or strain to begin urination?  0 - Not at all  7) How many times did you most typically get up to urinate from the time you went to bed until the time you got up in the morning?  2 - 2 times  Total score:  0-7 mildly symptomatic   8-19 moderately symptomatic   20-35 severely symptomatic  Score of 4 - mildly symptomatic.  Lung cancer:  Quit 28 years ago. Low Dose CT Chest recommended if Age 56-80 years, 30 pack-year currently smoking OR have quit w/in 15years. Patient does not qualify.  Discussed his use of pouches of smokeless tobacco - recommend cessation. AAA: N/A The USPSTF recommends one-time screening with ultrasonography in men ages 856 to 7375 years who have ever smoked ECG:  On file; no chest pain, shortness of breath, or palpitations.  Advanced Care Planning: A voluntary discussion about advance care planning including the explanation and discussion of advance directives.  Discussed health care proxy and Living will, and the patient was able to identify a health care proxy as Marge DuncansDonna Wing (Ex wife).  Patient does not have a living will at present time. If patient does have living will, I have requested they bring this to the clinic to be scanned in to their chart.  Patient Active Problem List   Diagnosis Date Noted  . Chronic pain of left knee 11/19/2017  . Pseudogout 11/19/2017  . Pain in joint of left hip 11/19/2017  . Pain, joint, multiple sites 11/19/2017  . DDD (degenerative disc disease), lumbar 11/19/2017  . Rheumatoid factor positive 11/14/2017  . Generalized osteoarthritis 11/14/2017  . Chondrocalcinosis 11/14/2017  . PTSD (post-traumatic stress disorder) 10/27/2017  . Chronic midline low back pain with left-sided sciatica 10/27/2017  . Obstructive sleep apnea 11/02/2016  .  High triglycerides 09/10/2015  . Fatty liver 09/10/2015  . Overweight (BMI 25.0-29.9) 11/06/2014  . Allergic rhinitis 11/06/2014  . Low HDL (under 40)   . Chronic anxiety 10/21/2014  . Depression 07/15/2014  . Agoraphobia without history of panic disorder   . Elevated serum glutamic pyruvic transaminase (SGPT) level     Past Surgical History:  Procedure Laterality Date  . NASAL SINUS SURGERY  2008  Family History  Problem Relation Age of Onset  . Heart disease Father        heart attack age 89  . Heart attack Father   . Hyperlipidemia Father   . Sleep apnea Father   . Colon polyps Father   . Cancer Brother        hodgins, liver cancer  . Cancer Brother     Social History   Socioeconomic History  . Marital status: Divorced    Spouse name: Not on file  . Number of children: 1  . Years of education: Not on file  . Highest education level: Not on file  Occupational History  . Not on file  Tobacco Use  . Smoking status: Former Smoker    Packs/day: 1.00    Years: 18.00    Pack years: 18.00    Types: Cigarettes    Quit date: 03/28/1990    Years since quitting: 28.9  . Smokeless tobacco: Current User    Types: Snuff  Substance and Sexual Activity  . Alcohol use: Yes    Alcohol/week: 1.0 standard drinks    Types: 1 Cans of beer per week    Comment: occasionally  . Drug use: No  . Sexual activity: Not Currently  Other Topics Concern  . Not on file  Social History Narrative  . Not on file   Social Determinants of Health   Financial Resource Strain:   . Difficulty of Paying Living Expenses: Not on file  Food Insecurity:   . Worried About Charity fundraiser in the Last Year: Not on file  . Ran Out of Food in the Last Year: Not on file  Transportation Needs:   . Lack of Transportation (Medical): Not on file  . Lack of Transportation (Non-Medical): Not on file  Physical Activity: Sufficiently Active  . Days of Exercise per Week: 5 days  . Minutes of Exercise  per Session: 30 min  Stress: No Stress Concern Present  . Feeling of Stress : Not at all  Social Connections: Slightly Isolated  . Frequency of Communication with Friends and Family: More than three times a week  . Frequency of Social Gatherings with Friends and Family: More than three times a week  . Attends Religious Services: More than 4 times per year  . Active Member of Clubs or Organizations: Yes  . Attends Archivist Meetings: More than 4 times per year  . Marital Status: Divorced  Human resources officer Violence: Not At Risk  . Fear of Current or Ex-Partner: No  . Emotionally Abused: No  . Physically Abused: No  . Sexually Abused: No     Current Outpatient Medications:  .  ALLERGY RELIEF 180 MG tablet, Take 1 tablet (180 mg total) by mouth daily as needed for allergies or rhinitis., Disp: 30 tablet, Rfl: 0 .  azelastine (ASTELIN) 0.1 % nasal spray, Place 2 sprays into both nostrils 2 (two) times daily. Use in each nostril as directed, Disp: 30 mL, Rfl: 11 .  cholecalciferol (VITAMIN D) 1000 UNITS tablet, Take 1,000 Units by mouth daily., Disp: , Rfl:  .  meloxicam (MOBIC) 7.5 MG tablet, TAKE 1 TABLET DAILY AS NEEDED FOR PAIN - TAKE WITH FOOD, Disp: 90 tablet, Rfl: 1 .  Multiple Vitamin (MULTIVITAMIN) tablet, Take 1 tablet by mouth daily., Disp: , Rfl:  .  sertraline (ZOLOFT) 100 MG tablet, Take 1 tablet (100 mg total) by mouth daily., Disp: 30 tablet, Rfl: 0 .  vitamin B-12 (CYANOCOBALAMIN)  1000 MCG tablet, Take 1,000 mcg by mouth daily. , Disp: , Rfl:   Allergies  Allergen Reactions  . Augmentin [Amoxicillin-Pot Clavulanate] Nausea Only     ROS  Constitutional: Negative for fever or weight change.  Respiratory: Negative for cough and shortness of breath.   Cardiovascular: Negative for chest pain or palpitations.  Gastrointestinal: Negative for abdominal pain, no bowel changes.  Musculoskeletal: Negative for gait problem or joint swelling.  Skin: Negative for rash.   Neurological: Negative for dizziness or headache.  No other specific complaints in a complete review of systems (except as listed in HPI above).   Objective  Vitals:   03/19/19 0931  BP: 124/72  Pulse: 86  Resp: 16  Temp: 97.9 F (36.6 C)  TempSrc: Temporal  SpO2: 96%  Weight: 244 lb 4.8 oz (110.8 kg)  Height:  (1.93 m)    Body mass index is 29.74 kg/m.  Physical Exam  Constitutional: Patient appears well-developed and well-nourished. No distress.  HENT: Head: Normocephalic and atraumatic. Ears: B TMs ok, no erythema or effusion; Nose: Nose normal. Mouth/Throat: Oropharynx is clear and moist. No oropharyngeal exudate.  Eyes: Conjunctivae and EOM are normal. Pupils are equal, round, and reactive to light. No scleral icterus.  Neck: Normal range of motion. Neck supple. No JVD present. No thyromegaly present.  Cardiovascular: Normal rate, regular rhythm and normal heart sounds.  No murmur heard. No BLE edema. Pulmonary/Chest: Effort normal and breath sounds normal. No respiratory distress. Abdominal: Soft. Bowel sounds are normal, no distension. There is no tenderness. no masses MALE GENITALIA: Deferred RECTAL: Deferred Musculoskeletal: Normal range of motion, no joint effusions. No gross deformities Neurological: he is alert and oriented to person, place, and time. No cranial nerve deficit. Coordination, balance, strength, speech and gait are normal.  Skin: Skin is warm and dry. No rash noted. No erythema.  Psychiatric: Patient has a normal mood and affect. behavior is normal. Judgment and thought content normal.   No results found for this or any previous visit (from the past 2160 hour(s)).   PHQ2/9: Depression screen [facility] 2/9 03/19/2019 02/01/2019 02/01/2019 11/17/2017 10/19/2017  Decreased Interest 0 0  Down, Depressed, Hopeless 0 3  PHQ - 2 Score 0 3  Altered sleeping Tired, decreased energy Change in appetite 0 0 0 0 3   Feeling bad or failure about yourself  0 0 0 0 0  Trouble concentrating 0 0 0 0 1  Moving slowly or fidgety/restless 0 0 0 0 0  Suicidal thoughts 0 0 0 0 0  PHQ-9 Score Difficult doing work/chores - Not difficult at all Not difficult at all Not difficult at all Somewhat difficult    Fall Risk: Fall Risk  03/19/2019 02/01/2019 10/19/2017 02/03/2017 11/02/2016  Falls in the past year? 0 0 No No No  Comment - - - - -  Number falls in past yr: 0 0 - - -  Injury with Fall? 0 0 - - -  Follow up Falls evaluation completed Falls evaluation completed - - -    Assessment & Plan  1. Annual physical exam -Prostate cancer screening and PSA options (with potential risks and benefits of testing vs not testing) were discussed along with recent recs/guidelines. -USPSTF grade A and B recommendations reviewed with patient; age-appropriate recommendations, preventive care, screening tests, etc discussed and encouraged;  healthy living encouraged; see AVS for patient education given to patient -Discussed importance of 150 minutes of physical activity weekly, eat two servings of fish weekly, eat one serving of tree nuts ( cashews, pistachios, pecans, almonds.[name]) every other day, eat 6 servings of fruit/vegetables daily and drink plenty of water and avoid sweet beverages.  - Ambulatory referral to Dermatology - RPR - HIV Antibody (routine testing w rflx) - COMPLETE METABOLIC PANEL WITH GFR - Lipid panel - Hemoglobin A1c - Urine cytology ancillary only - Ambulatory referral to Gastroenterology - PSA  2. PTSD (post-traumatic stress disorder) - busPIRone (BUSPAR) 7.5 MG tablet; Take 1 tablet (7.5 mg total) by mouth at bedtime.  Dispense: 90 tablet; Refill: 0  3. Chronic anxiety - busPIRone (BUSPAR) 7.5 MG tablet; Take 1 tablet (7.5 mg total) by mouth at bedtime.  Dispense: 90 tablet; Refill: 0  4. Colon cancer screening - Ambulatory referral to Gastroenterology  5. Prostate cancer screening -  PSA  6. Routine screening for STI (sexually transmitted infection) - RPR - HIV Antibody (routine testing w rflx) - Urine cytology ancillary only  7. Low HDL (under 40) - Lipid panel  8. Skin cancer screening - Ambulatory referral to Dermatology  9. Overweight (BMI 25.0-29.9) - COMPLETE METABOLIC PANEL WITH GFR - Hemoglobin A1c  10. Male sexual dysfunction - Will trial Cialis PRN; however I strongly recommend counseling as I believe his dysfunction is likely secondary to his PTSD. - tadalafil (CIALIS) 20 MG tablet; Take 0.5-1 tablets (10-20 mg total) by mouth every other day as needed for erectile dysfunction.  Dispense: 10 tablet; Refill: 3

## 2019-03-20 LAB — URINE CYTOLOGY ANCILLARY ONLY
Chlamydia: NEGATIVE
Comment: NEGATIVE
Comment: NORMAL
Neisseria Gonorrhea: NEGATIVE

## 2019-03-20 LAB — HIV ANTIBODY (ROUTINE TESTING W REFLEX): HIV 1&2 Ab, 4th Generation: NONREACTIVE

## 2019-03-20 LAB — COMPLETE METABOLIC PANEL WITH GFR
AG Ratio: 1.4 (calc) (ref 1.0–2.5)
ALT: 51 U/L — ABNORMAL HIGH (ref 9–46)
AST: 44 U/L — ABNORMAL HIGH (ref 10–35)
Albumin: 4.5 g/dL (ref 3.6–5.1)
Alkaline phosphatase (APISO): 61 U/L (ref 35–144)
BUN: 13 mg/dL (ref 7–25)
CO2: 27 mmol/L (ref 20–32)
Calcium: 9.5 mg/dL (ref 8.6–10.3)
Chloride: 103 mmol/L (ref 98–110)
Creat: 0.98 mg/dL (ref 0.70–1.33)
GFR, Est African American: 99 mL/min/{1.73_m2} (ref >=60)
GFR, Est Non African American: 86 mL/min/{1.73_m2} (ref >=60)
Globulin: 3.2 g/dL (calc) (ref 1.9–3.7)
Glucose, Bld: 102 mg/dL — ABNORMAL HIGH (ref 65–99)
Potassium: 4.4 mmol/L (ref 3.5–5.3)
Sodium: 141 mmol/L (ref 135–146)
Total Bilirubin: 0.5 mg/dL (ref 0.2–1.2)
Total Protein: 7.7 g/dL (ref 6.1–8.1)

## 2019-03-20 LAB — LIPID PANEL
Cholesterol: 174 mg/dL (ref <200)
HDL: 41 mg/dL (ref >=40)
LDL Cholesterol (Calc): 106 mg/dL (calc) — ABNORMAL HIGH
Non-HDL Cholesterol (Calc): 133 mg/dL (calc) — ABNORMAL HIGH (ref <130)
Total CHOL/HDL Ratio: 4.2 (calc) (ref <5.0)
Triglycerides: 158 mg/dL — ABNORMAL HIGH (ref <150)

## 2019-03-20 LAB — PSA: PSA: 0.7 ng/mL (ref <=4.0)

## 2019-03-20 LAB — HEMOGLOBIN A1C
Hgb A1c MFr Bld: 5.9 % of total Hgb — ABNORMAL HIGH (ref <5.7)
Mean Plasma Glucose: 123 (calc)
eAG (mmol/L): 6.8 (calc)

## 2019-03-20 LAB — RPR: RPR Ser Ql: NONREACTIVE

## 2019-03-21 ENCOUNTER — Other Ambulatory Visit: Payer: Self-pay | Admitting: Emergency Medicine

## 2019-03-21 DIAGNOSIS — N539 Unspecified male sexual dysfunction: Secondary | ICD-10-CM

## 2019-03-25 ENCOUNTER — Telehealth: Payer: Self-pay

## 2019-03-25 ENCOUNTER — Ambulatory Visit: Payer: Self-pay | Admitting: *Deleted

## 2019-03-25 DIAGNOSIS — F419 Anxiety disorder, unspecified: Secondary | ICD-10-CM

## 2019-03-25 DIAGNOSIS — F4310 Post-traumatic stress disorder, unspecified: Secondary | ICD-10-CM

## 2019-03-25 NOTE — Chronic Care Management (AMB) (Signed)
   Care Management   Unsuccessful Call Note 03/25/2019 Name: Richard [name]. MRN: [id number] DOB: [birth year]  Patient  is a 58 year old male who sees Raelyn Ensign, FNP for primary care. Raelyn Ensign, FNP asked the CCM team to consult the patient for Mental Health Counseling and Resources.     This social worker was unable to reach patient via telephone today for mental health resource follow up. I have left HIPAA compliant voicemail asking patient to return my call. (unsuccessful outreach #1).  This social worker was able to identify the [employer] as a agency that does trauma work on a sliding scale. Will provide this information to patient upon his return call.   Plan: Will follow-up within 7 business days via telephone.     Elliot Gurney, [place] Administrator, [facility] Center[facility] Care Management [phone]

## 2019-03-26 MED ORDER — TADALAFIL 20 MG PO TABS: 10.0000 mg | ORAL_TABLET | ORAL | 3 refills | Status: DC | PRN

## 2019-03-26 NOTE — Chronic Care Management (AMB) (Signed)
Care Management    Clinical Social Work Follow Up Note  03/26/2019 Name: Richard [name]. MRN: [id number] DOB: 03-08-1961  Richard [name]. is a 58 y.o. year old male who is a primary care patient of Doren Custard, FNP. The CCM team was consulted for assistance with Mental Health Counseling and Resources.   Review of patient status, including review of consultants reports, other relevant assessments, and collaboration with appropriate care team members and the patient's provider was performed as part of comprehensive patient evaluation and provision of chronic care management services.     Advanced Directives Status: <no information> See Care Plan for related entries.   Outpatient Encounter Medications as of 03/25/2019  Medication Sig  . ALLERGY RELIEF 180 MG tablet Take 1 tablet (180 mg total) by mouth daily as needed for allergies or rhinitis.  [name] azelastine (ASTELIN) 0.1 % nasal spray Place 2 sprays into both nostrils 2 (two) times daily. Use in each nostril as directed  . busPIRone (BUSPAR) 7.5 MG tablet Take 1 tablet (7.5 mg total) by mouth at bedtime.  . cholecalciferol (VITAMIN D) 1000 UNITS tablet Take 1,000 Units by mouth daily.  . meloxicam (MOBIC) 7.5 MG tablet TAKE 1 TABLET DAILY AS NEEDED FOR PAIN - TAKE WITH FOOD  . Multiple Vitamin (MULTIVITAMIN) tablet Take 1 tablet by mouth daily.  . sertraline (ZOLOFT) 100 MG tablet Take 1 tablet (100 mg total) by mouth daily.  . tadalafil (CIALIS) 20 MG tablet Take 0.5-1 tablets (10-20 mg total) by mouth every other day as needed for erectile dysfunction.  . vitamin B-12 (CYANOCOBALAMIN) 1000 MCG tablet Take 1,000 mcg by mouth daily.    No facility-administered encounter medications on file as of 03/25/2019.     Goals Addressed            This Visit's Progress   . 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   [name] Suicidal Ideation/Homicidal Ideation: No  Clinical Social Work Goal(s):  [name] Over the next 90 days, patient will follow up with a mental health provider that specializes in PTSD* as directed by SW  Interventions: . Continued to discuss recommendation to follow up with a therapist that specializes in trauma . Confirmed that therapist  through the [facility] is no longer available and alternate plans will need to be made . Encouraged patient to contact his insurance company for [employer] as well considering Theatre manager for the [employer] . Confirmed that patient agrees to contact his insurance company for a list of in-network mental health providers . Discussed plans with patient for ongoing care management follow up and provided patient with direct contact information for care management team   Patient Self Care Activities:  . Performs ADL's independently . Performs IADL's independently . Calls provider office for new concerns or questions . Ability for insight . Motivation for treatment  Patient Coping Strengths:  . Supportive Relationships . Friends . Church . Able to Communicate Effectively  Patient Self Care Deficits:  [name] Knowledge deficit of local providers that specialize in trauma  Please see past updates related to this goal by clicking on the "Past Updates" button in the selected goal          Follow Up Plan: SW will follow up with patient by phone over the  next 2 weeks   [place], [place] [facility] Care Management [phone]

## 2019-03-26 NOTE — Patient Instructions (Signed)
Thank you allowing the Chronic Care Management Team to be a part of your care! It was a pleasure speaking with you today!  1. Please contact your insurance company regarding in network mental health providers  CCM (Chronic Care Management) Team    Neldon Labella RN, BSN Nurse Care Coordinator  [phone]  Ruben Reason PharmD  Clinical Pharmacist  [phone]   Elliot Gurney, LCSW Clinical Social Worker [phone]  Goals Addressed            This Visit's Progress   . 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   [name] Suicidal Ideation/Homicidal Ideation: No  Clinical Social Work Goal(s):  [name] Over the next 90 days, patient will follow up with a mental health provider that specializes in PTSD* as directed by SW  Interventions: . Continued to discuss recommendation to follow up with a therapist that specializes in trauma . Confirmed that therapist  through the [facility] is no longer available and alternate plans will need to be made . Encouraged patient to contact his insurance company for [employer] as well considering Music therapist for the [employer] . Confirmed that patient agrees to contact his insurance company for a list of in-network mental health providers . Discussed plans with patient for ongoing care management follow up and provided patient with direct contact information for care management team   Patient Self Care Activities:  . Performs ADL's independently . Performs IADL's independently . Calls provider office for new concerns or questions . Ability for insight . Motivation for treatment  Patient Coping Strengths:  . Supportive Relationships . Friends . Church . Able to Communicate Effectively  Patient Self Care Deficits:  [name] Knowledge deficit of local providers that specialize in  trauma  Please see past updates related to this goal by clicking on the "Past Updates" button in the selected goal          The patient verbalized understanding of instructions provided today and declined a print copy of patient instruction materials.   Telephone follow up appointment with care management team member scheduled for: 04/09/19

## 2019-04-01 ENCOUNTER — Telehealth: Payer: Self-pay

## 2019-04-05 ENCOUNTER — Encounter: Payer: Self-pay | Admitting: *Deleted

## 2019-04-08 ENCOUNTER — Encounter: Payer: Self-pay | Admitting: *Deleted

## 2019-04-08 ENCOUNTER — Ambulatory Visit: Payer: Self-pay | Admitting: *Deleted

## 2019-04-08 ENCOUNTER — Telehealth: Payer: Self-pay | Admitting: *Deleted

## 2019-04-08 NOTE — Chronic Care Management (AMB) (Addendum)
    Care Management   Unsuccessful Call Note 04/08/2019 Name: Richard [name]. MRN: [id number] DOB: December 20, 1960  Patient  is a 59 year old male who sees Maurice Small, FNP for primary care. Maurice Small, FNP asked the CCM team to consult the patient for Counseling and Mental Health Resources.    This social worker was unable to reach patient via telephone today for follow up call. I have left HIPAA compliant voicemail asking patient to return my call. (unsuccessful outreach #1).   Plan: Will follow-up within 7 business days via telephone.     Verna Czech, LCSW Clinical Social Ecologist [facility] Care Management [phone]

## 2019-04-09 NOTE — Progress Notes (Signed)
This encounter was created in error - please disregard.

## 2019-04-15 ENCOUNTER — Telehealth: Payer: Self-pay

## 2019-04-15 ENCOUNTER — Ambulatory Visit: Payer: Self-pay | Admitting: *Deleted

## 2019-04-15 NOTE — Chronic Care Management (AMB) (Signed)
    Care Management   Unsuccessful Call Note 04/15/2019 Name: Richard [name]. MRN: [id number] DOB: November 09, 1960  Patient is a 59 year old male who sees Maurice Small FNP for primary care. Maurice Small, FNP asked the CCM team to consult the patient for Mental Health Counseling Resources.      This social was unable to reach patient via telephone today for follow up call. I have left HIPAA compliant voicemail asking patient to return my call. (unsuccessful outreach #3).   Plan: This Child psychotherapist will not make any additional calls to patient, however will be happy to engage patient upon return call.    Verna Czech, LCSW Clinical Social Ecologist [facility] Care Management [phone] \

## 2019-05-02 ENCOUNTER — Ambulatory Visit: Payer: No Typology Code available for payment source | Attending: Internal Medicine

## 2019-05-02 DIAGNOSIS — Z20822 Contact with and (suspected) exposure to covid-19: Secondary | ICD-10-CM

## 2019-05-03 LAB — NOVEL CORONAVIRUS, NAA: SARS-CoV-2, NAA: NOT DETECTED

## 2019-08-05 ENCOUNTER — Emergency Department
Admission: EM | Admit: 2019-08-05 | Discharge: 2019-08-05 | Disposition: A | Discharge status: Home / Self Care | Payer: PRIVATE HEALTH INSURANCE | Attending: Student | Admitting: Student

## 2019-08-05 ENCOUNTER — Emergency Department: Payer: PRIVATE HEALTH INSURANCE

## 2019-08-05 ENCOUNTER — Other Ambulatory Visit: Payer: Self-pay

## 2019-08-05 DIAGNOSIS — F17228 Nicotine dependence, chewing tobacco, with other nicotine-induced disorders: Secondary | ICD-10-CM | POA: Diagnosis not present

## 2019-08-05 DIAGNOSIS — M25561 Pain in right knee: Secondary | ICD-10-CM | POA: Diagnosis present

## 2019-08-05 DIAGNOSIS — Z79899 Other long term (current) drug therapy: Secondary | ICD-10-CM | POA: Insufficient documentation

## 2019-08-05 DIAGNOSIS — I10 Essential (primary) hypertension: Secondary | ICD-10-CM | POA: Diagnosis not present

## 2019-08-05 DIAGNOSIS — M25569 Pain in unspecified knee: Secondary | ICD-10-CM

## 2019-08-05 MED ORDER — IBUPROFEN 600 MG PO TABS
600.0000 mg | ORAL_TABLET | Freq: Once | ORAL | Status: AC
  Administered 2019-08-05: 600 mg via ORAL
  Filled 2019-08-05: qty 1
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
## 2019-08-05 NOTE — Discharge Instructions (Addendum)
Thank you for letting us take care of you in the emergency department today.   Please continue to take any regular, prescribed medications.   Please follow up with: Orthopedics, information below  Please return to the ER for any new or worsening symptoms.

## 2019-08-08 ENCOUNTER — Other Ambulatory Visit: Payer: Self-pay | Admitting: Family Medicine

## 2019-08-08 NOTE — Telephone Encounter (Signed)
Pt saw E. Annye Asa 03/18/2020 and has appt with her 08/22/2019

## 2019-08-22 ENCOUNTER — Ambulatory Visit: Payer: 59 | Admitting: Family Medicine

## 2019-09-02 ENCOUNTER — Encounter: Payer: Self-pay | Admitting: Family Medicine

## 2019-09-02 ENCOUNTER — Other Ambulatory Visit: Payer: Self-pay

## 2019-09-02 ENCOUNTER — Ambulatory Visit (INDEPENDENT_AMBULATORY_CARE_PROVIDER_SITE_OTHER): Payer: No Typology Code available for payment source | Admitting: Family Medicine

## 2019-09-02 VITALS — BP 122/78 | HR 78 | Temp 98.3°F | Resp 14 | Ht 76.0 in | Wt 246.3 lb

## 2019-09-02 DIAGNOSIS — E785 Hyperlipidemia, unspecified: Secondary | ICD-10-CM

## 2019-09-02 DIAGNOSIS — F4310 Post-traumatic stress disorder, unspecified: Secondary | ICD-10-CM | POA: Diagnosis not present

## 2019-09-02 DIAGNOSIS — F330 Major depressive disorder, recurrent, mild: Secondary | ICD-10-CM | POA: Diagnosis not present

## 2019-09-02 DIAGNOSIS — R7303 Prediabetes: Secondary | ICD-10-CM

## 2019-09-02 DIAGNOSIS — F419 Anxiety disorder, unspecified: Secondary | ICD-10-CM | POA: Diagnosis not present

## 2019-09-02 DIAGNOSIS — Z1211 Encounter for screening for malignant neoplasm of colon: Secondary | ICD-10-CM

## 2019-09-02 DIAGNOSIS — K760 Fatty (change of) liver, not elsewhere classified: Secondary | ICD-10-CM | POA: Diagnosis not present

## 2019-09-02 DIAGNOSIS — R7989 Other specified abnormal findings of blood chemistry: Secondary | ICD-10-CM

## 2019-09-02 MED ORDER — SERTRALINE HCL 100 MG PO TABS: 100.0000 mg | ORAL_TABLET | Freq: Every day | ORAL | 3 refills | Status: DC
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
## 2019-09-02 NOTE — Patient Instructions (Addendum)
[facility]  [phone]  5 providers LCSW, psychologists, licensed clinical mental health counselors - call them or look them up online - and let me know which one you would like me to put the referrals.

## 2019-09-03 LAB — LIPID PANEL
Cholesterol: 169 mg/dL (ref <200)
HDL: 36 mg/dL — ABNORMAL LOW (ref >=40)
LDL Cholesterol (Calc): 105 mg/dL (calc) — ABNORMAL HIGH
Non-HDL Cholesterol (Calc): 133 mg/dL (calc) — ABNORMAL HIGH (ref <130)
Total CHOL/HDL Ratio: 4.7 (calc) (ref <5.0)
Triglycerides: 160 mg/dL — ABNORMAL HIGH (ref <150)

## 2019-09-03 LAB — COMPLETE METABOLIC PANEL WITH GFR
AG Ratio: 1.4 (calc) (ref 1.0–2.5)
ALT: 51 U/L — ABNORMAL HIGH (ref 9–46)
AST: 36 U/L — ABNORMAL HIGH (ref 10–35)
Albumin: 4.2 g/dL (ref 3.6–5.1)
Alkaline phosphatase (APISO): 61 U/L (ref 35–144)
BUN: 12 mg/dL (ref 7–25)
CO2: 30 mmol/L (ref 20–32)
Calcium: 9.3 mg/dL (ref 8.6–10.3)
Chloride: 105 mmol/L (ref 98–110)
Creat: 1.06 mg/dL (ref 0.70–1.33)
GFR, Est African American: 90 mL/min/{1.73_m2} (ref >=60)
GFR, Est Non African American: 78 mL/min/{1.73_m2} (ref >=60)
Globulin: 3 g/dL (calc) (ref 1.9–3.7)
Glucose, Bld: 98 mg/dL (ref 65–99)
Potassium: 3.9 mmol/L (ref 3.5–5.3)
Sodium: 140 mmol/L (ref 135–146)
Total Bilirubin: 0.6 mg/dL (ref 0.2–1.2)
Total Protein: 7.2 g/dL (ref 6.1–8.1)

## 2019-09-03 LAB — HEMOGLOBIN A1C
Hgb A1c MFr Bld: 5.8 % of total Hgb — ABNORMAL HIGH (ref <5.7)
Mean Plasma Glucose: 120 (calc)
eAG (mmol/L): 6.6 (calc)

## 2019-09-12 ENCOUNTER — Other Ambulatory Visit: Payer: Self-pay | Admitting: Family Medicine

## 2019-09-12 ENCOUNTER — Telehealth: Payer: Self-pay | Admitting: Family Medicine

## 2019-09-12 DIAGNOSIS — M5442 Lumbago with sciatica, left side: Secondary | ICD-10-CM

## 2019-09-12 DIAGNOSIS — G8929 Other chronic pain: Secondary | ICD-10-CM

## 2019-09-12 NOTE — Telephone Encounter (Signed)
Medication Refill - Medication:   meloxicam (MOBIC) 7.5 MG tablet    ALLERGY RELIEF 180 MG tablet   Has the patient contacted their pharmacy? Yes.   (Agent: If no, request that the patient contact the pharmacy for the refill.) (Agent: If yes, when and what did the pharmacy advise?)  Preferred Pharmacy (with phone number or street name):  [facility] - [place], [place] - [address] Phone:  [phone]  Fax:  [phone]        Agent: Please be advised that RX refills may take up to 3 business days. We ask that you follow-up with your pharmacy.

## 2019-09-13 NOTE — Telephone Encounter (Signed)
Refilled on 6/17

## 2020-02-02 ENCOUNTER — Other Ambulatory Visit: Payer: Self-pay

## 2020-02-02 ENCOUNTER — Emergency Department
Admission: EM | Admit: 2020-02-02 | Discharge: 2020-02-02 | Disposition: A | Discharge status: Home / Self Care | Payer: PRIVATE HEALTH INSURANCE | Attending: Emergency Medicine | Admitting: Emergency Medicine

## 2020-02-02 DIAGNOSIS — Y990 Civilian activity done for income or pay: Secondary | ICD-10-CM | POA: Insufficient documentation

## 2020-02-02 DIAGNOSIS — Z87891 Personal history of nicotine dependence: Secondary | ICD-10-CM | POA: Diagnosis not present

## 2020-02-02 DIAGNOSIS — I10 Essential (primary) hypertension: Secondary | ICD-10-CM | POA: Insufficient documentation

## 2020-02-02 DIAGNOSIS — M545 Low back pain, unspecified: Secondary | ICD-10-CM | POA: Insufficient documentation

## 2020-02-02 DIAGNOSIS — Z79899 Other long term (current) drug therapy: Secondary | ICD-10-CM | POA: Diagnosis not present
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
## 2020-03-19 ENCOUNTER — Encounter: Payer: No Typology Code available for payment source | Admitting: Family Medicine

## 2020-04-24 ENCOUNTER — Other Ambulatory Visit: Payer: Self-pay

## 2020-04-24 DIAGNOSIS — J309 Allergic rhinitis, unspecified: Secondary | ICD-10-CM

## 2020-04-24 MED ORDER — AZELASTINE HCL 0.1 % NA SOLN: 2.0000 | Freq: Two times a day (BID) | NASAL | 11 refills | Status: DC

## 2020-04-29 ENCOUNTER — Encounter: Payer: No Typology Code available for payment source | Admitting: Dermatology

## 2020-06-12 ENCOUNTER — Emergency Department: Payer: PRIVATE HEALTH INSURANCE

## 2020-06-12 ENCOUNTER — Encounter: Payer: Self-pay | Admitting: Emergency Medicine

## 2020-06-12 ENCOUNTER — Other Ambulatory Visit: Payer: Self-pay

## 2020-06-12 ENCOUNTER — Emergency Department
Admission: EM | Admit: 2020-06-12 | Discharge: 2020-06-12 | Disposition: A | Discharge status: Home / Self Care | Payer: PRIVATE HEALTH INSURANCE | Attending: Emergency Medicine | Admitting: Emergency Medicine

## 2020-06-12 DIAGNOSIS — S2231XA Fracture of one rib, right side, initial encounter for closed fracture: Secondary | ICD-10-CM | POA: Diagnosis not present

## 2020-06-12 DIAGNOSIS — W1809XA Striking against other object with subsequent fall, initial encounter: Secondary | ICD-10-CM | POA: Diagnosis not present

## 2020-06-12 DIAGNOSIS — Y990 Civilian activity done for income or pay: Secondary | ICD-10-CM | POA: Insufficient documentation

## 2020-06-12 DIAGNOSIS — I10 Essential (primary) hypertension: Secondary | ICD-10-CM | POA: Insufficient documentation

## 2020-06-12 DIAGNOSIS — Z87891 Personal history of nicotine dependence: Secondary | ICD-10-CM | POA: Insufficient documentation

## 2020-06-12 DIAGNOSIS — S299XXA Unspecified injury of thorax, initial encounter: Secondary | ICD-10-CM | POA: Diagnosis present

## 2020-06-12 MED ORDER — HYDROCODONE-ACETAMINOPHEN 5-325 MG PO TABS: 1.0000 | ORAL_TABLET | ORAL | 0 refills | Status: DC | PRN
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
## 2020-06-15 ENCOUNTER — Encounter: Payer: No Typology Code available for payment source | Admitting: Dermatology

## 2020-07-10 ENCOUNTER — Other Ambulatory Visit: Payer: Self-pay

## 2020-07-10 MED ORDER — METHOCARBAMOL 500 MG PO TABS
ORAL_TABLET | ORAL | 0 refills | Status: DC
  Filled 2020-07-10: qty 14, 14d supply, fill #0

## 2020-07-10 MED ORDER — MELOXICAM 15 MG PO TABS
ORAL_TABLET | ORAL | 0 refills | Status: DC
  Filled 2020-07-10: qty 6, 6d supply, fill #0
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
## 2020-10-01 ENCOUNTER — Other Ambulatory Visit: Payer: Self-pay

## 2020-10-01 ENCOUNTER — Encounter: Payer: Self-pay | Admitting: Family Medicine

## 2020-10-01 ENCOUNTER — Ambulatory Visit (INDEPENDENT_AMBULATORY_CARE_PROVIDER_SITE_OTHER): Payer: No Typology Code available for payment source | Admitting: Family Medicine

## 2020-10-01 VITALS — BP 124/64 | HR 86 | Temp 98.1°F | Resp 16 | Ht 76.0 in | Wt 229.7 lb

## 2020-10-01 DIAGNOSIS — Z0000 Encounter for general adult medical examination without abnormal findings: Secondary | ICD-10-CM

## 2020-10-01 DIAGNOSIS — Z1211 Encounter for screening for malignant neoplasm of colon: Secondary | ICD-10-CM | POA: Diagnosis not present

## 2020-10-01 DIAGNOSIS — Z23 Encounter for immunization: Secondary | ICD-10-CM

## 2020-10-01 DIAGNOSIS — E785 Hyperlipidemia, unspecified: Secondary | ICD-10-CM | POA: Diagnosis not present

## 2020-10-01 DIAGNOSIS — Z125 Encounter for screening for malignant neoplasm of prostate: Secondary | ICD-10-CM

## 2020-10-01 DIAGNOSIS — F4310 Post-traumatic stress disorder, unspecified: Secondary | ICD-10-CM

## 2020-10-01 MED ORDER — ZOSTER VAC RECOMB ADJUVANTED 50 MCG/0.5ML IM SUSR: 0.5000 mL | Freq: Once | INTRAMUSCULAR | 1 refills | Status: AC

## 2020-10-01 NOTE — Patient Instructions (Addendum)
Health Maintenance  Topic Date Due   COVID-19 Vaccine (1) Never done   Fecal DNA (Cologuard)  Never done   Zoster Vaccines- Shingrix (1 of 2) 01/01/2021 (Originally 04/08/2012)   INFLUENZA VACCINE  10/26/2020   TETANUS/TDAP  11/04/2020   Hepatitis C Screening  Completed   HIV Screening  Completed   Pneumococcal Vaccine 48-58 Years old  Aged Out   HPV VACCINES  Aged Out   Cholesterol and statins:  The 10-year ASCVD risk score Denman George DC Montez Hageman., et al., 2013) is: 7.5%   Values used to calculate the score:     Age: 60 years     Sex: Male     Is Non-Hispanic African American: No     Diabetic: No     Tobacco smoker: No     Systolic Blood Pressure: 124 mmHg     Is BP treated: No     HDL Cholesterol: 36 mg/dL     Total Cholesterol: 169 mg/dL  Recommendations on cholesterol and starting statins.   There is a benefit from LDL-C (bad cholesterol) lowering with statin therapy at virtually all levels of cardiovascular risk.  If statin therapy had no side effects and caused no financial burden, it might be reasonable to recommend it to virtually all at-risk individuals, similar to a healthy diet and exercise  It is this good of a medication!!  It reduces risk in almost everyone.   There are possible side effects with ALL medications and with statins there is a small subset of the population who may not metabolize it well, which causing muscle aches as a side effect (~5%).  We monitor for this, can test for this, and usually are careful to work with you to get a medication that gives you the benefits with minimal side effects.  Some people are sensitive to medications in general and we try to use the highest dose tolerated to give the most benefit.   American Heart Association/American College of Cardiology cholesterol and statin guidelines are as follows: In adults 49 to 58 years of age without diabetes mellitus and with LDL-C levels ?70, at a 10-year atherosclerotic cardiovascular disease risk of ?7.5  percent, start a moderate-intensity statin if a discussion of treatment options favors statin therapy  If LDL is >160, statins are indicated.  Patients with other significant risk factors would also benefit from statin.  Some of these factors include a family history of premature cardiovascular disease, chronic kidney disease, or chronic inflammatory disorder (such as chronic human immunodeficiency viral infection).   Can get more information at the following website:  [employer]Aff[employer]ateProgram.fi         Preventive Care 58-67 Years Old, Male Preventive care refers to lifestyle choices and visits with your health care provider that can promote health and wellness. This includes: A yearly physical exam. This is also called an annual wellness visit. Regular dental and eye exams. Immunizations. Screening for certain conditions. Healthy lifestyle choices, such as: Eating a healthy diet. Getting regular exercise. Not using drugs or products that contain nicotine and tobacco. Limiting alcohol use. What can I expect for my preventive care visit? Physical exam Your health care provider will check your: Height and weight. These may be used to calculate your BMI (body mass index). BMI is a measurement that tells if you are at a healthy weight. Heart rate and blood pressure. Body temperature. Skin for abnormal spots. Counseling Your health care provider may ask you questions about your: Past medical problems. Family's medical  history. Alcohol, tobacco, and drug use. Emotional well-being. Home life and relationship well-being. Sexual activity. Diet, exercise, and sleep habits. Work and work Astronomer. Access to firearms. What immunizations do I need?  Vaccines are usually given at various ages, according to a schedule. Your health care provider will recommend vaccines for you based on your age, medicalhistory, and lifestyle or  other factors, such as travel or where you work. What tests do I need? Blood tests Lipid and cholesterol levels. These may be checked every 5 years, or more often if you are over 58 years old. Hepatitis C test. Hepatitis B test. Screening Lung cancer screening. You may have this screening every year starting at age 70 if you have a 30-pack-year history of smoking and currently smoke or have quit within the past 15 years. Prostate cancer screening. Recommendations will vary depending on your family history and other risks. Genital exam to check for testicular cancer or hernias. Colorectal cancer screening. All adults should have this screening starting at age 58 and continuing until age 9. Your health care provider may recommend screening at age 36 if you are at increased risk. You will have tests every 1-10 years, depending on your results and the type of screening test. Diabetes screening. This is done by checking your blood sugar (glucose) after you have not eaten for a while (fasting). You may have this done every 1-3 years. STD (sexually transmitted disease) testing, if you are at risk. Follow these instructions at home: Eating and drinking  Eat a diet that includes fresh fruits and vegetables, whole grains, lean protein, and low-fat dairy products. Take vitamin and mineral supplements as recommended by your health care provider. Do not drink alcohol if your health care provider tells you not to drink. If you drink alcohol: Limit how much you have to 0-2 drinks a day. Be aware of how much alcohol is in your drink. In the [place] one drink equals one 12 oz bottle of beer (355 mL), one 5 oz glass of wine (148 mL), or one 1 oz glass of hard liquor (44 mL).  Lifestyle Take daily care of your teeth and gums. Brush your teeth every morning and night with fluoride toothpaste. Floss one time each day. Stay active. Exercise for at least 30 minutes 5 or more days each week. Do not use any  products that contain nicotine or tobacco, such as cigarettes, e-cigarettes, and chewing tobacco. If you need help quitting, ask your health care provider. Do not use drugs. If you are sexually active, practice safe sex. Use a condom or other form of protection to prevent STIs (sexually transmitted infections). If told by your health care provider, take low-dose aspirin daily starting at age 71. Find healthy ways to cope with stress, such as: Meditation, yoga, or listening to music. Journaling. Talking to a trusted person. Spending time with friends and family. Safety Always wear your seat belt while driving or riding in a vehicle. Do not drive: If you have been drinking alcohol. Do not ride with someone who has been drinking. When you are tired or distracted. While texting. Wear a helmet and other protective equipment during sports activities. If you have firearms in your house, make sure you follow all gun safety procedures. What's next? Go to your health care provider once a year for an annual wellness visit. Ask your health care provider how often you should have your eyes and teeth checked. Stay up to date on all vaccines. This information is not  intended to replace advice given to you by your health care provider. Make sure you discuss any questions you have with your healthcare provider. Document Revised: 12/11/2018 Document Reviewed: 03/08/2018 Elsevier Patient Education  2022 [employer].

## 2020-10-01 NOTE — Progress Notes (Signed)
Patient: Richard Mcnellis., Male    DOB: 10-04-1960, 60 y.o.   MRN: [id number] Richard Berry, PA-C Visit Date: 10/01/2020  Today's Provider: Danelle Berry, PA-C   Chief Complaint  Patient presents with   Annual Exam   Subjective:   Annual physical exam:  Richard Markin. is a 60 y.o. male who presents today for health maintenance and annual & complete physical exam.   Exercise/Activity: exercises 3 x a week Diet/nutrition:  limiting bread and starches, less fried food, more grilled, more veggies - trying to be healthier Sleep: sometimes has nightmares with PTSD with past experiences    USPSTF grade A and B recommendations - reviewed and addressed today  Depression:   Phq 9 completed today by patient, was reviewed by me with patient in the room, score is  negative, pt feels overall well He got off zoloft with job change and improving anxiety and less ptsd PHQ 2/9 Scores 10/01/2020 09/02/2019 03/19/2019 02/01/2019  PHQ - 2 Score 0 0 2 2  PHQ- 9 Score 0 2 4 4     Hep C Screening: done STD testing and prevention (HIV/chl/gon/syphilis): done in the past, none needed today Intimate partner violence:  feels safe at home, denies abuse  Prostate cancer:  Prostate cancer screening with PSA: Discussed risks and benefits of PSA testing and provided handout. Pt wishes to have PSA drawn today.  Lab Results  Component Value Date   PSA 0.7 03/19/2019    IPSS     Row Name 10/01/20 1523         International Prostate Symptom Score   How often have you had the sensation of not emptying your bladder? Not at All     How often have you had to urinate less than every two hours? Less than 1 in 5 times     How often have you found you stopped and started again several times when you urinated? Not at All     How often have you found it difficult to postpone urination? Less than half the time     How often have you had a weak urinary stream? Not at All     How often have you had to strain to  start urination? Not at All     How many times did you typically get up at night to urinate? 1 Time     Total IPSS Score 4           Quality of Life due to urinary symptoms     If you were to spend the rest of your life with your urinary condition just the way it is now how would you feel about that? Mostly Satisfied              Advanced Care Planning:  A voluntary discussion about advance care planning including the explanation and discussion of advance directives.  Discussed health care proxy and Living will, and the patient was able to identify a health care proxy as Richard [name].  Patient does not have a living will at present time. If patient does have living will, I have requested they bring this to the clinic to be scanned in to their chart.  Health Maintenance  Topic Date Due   COVID-19 Vaccine (1) Never done   Fecal DNA (Cologuard)  Never done   Zoster Vaccines- Shingrix (1 of 2) 01/01/2021 (Originally 04/08/2012)   INFLUENZA VACCINE  10/26/2020   TETANUS/TDAP  11/04/2020   Hepatitis  C Screening  Completed   HIV Screening  Completed   Pneumococcal Vaccine 59-37 Years old  Aged Out   HPV VACCINES  Aged Out    Skin cancer:  Pt reports no hx of skin cancer, suspicious lesions/biopsies in the past.  Colorectal cancer:  colonoscopy is due - discussed options, FIT vs cologuard vs colonoscopy   Pt denies melena, hematochezia, change in bowels, abd pain unintentional weight loss  Lung cancer:   Low Dose CT Chest recommended if Age 19-80 years, 20 pack-year currently smoking OR have quit w/in 15years. Patient does not qualify.   Social History   Tobacco Use   Smoking status: Former    Packs/day: 1.00    Years: 18.00    Pack years: 18.00    Types: Cigarettes    Quit date: 03/28/1990    Years since quitting: 30.5   Smokeless tobacco: Current    Types: Snuff  Substance Use Topics   Alcohol use: Yes    Alcohol/week: 1.0 standard drink    Types: 1 Cans of beer per week     Comment: occasionally     Alcohol screening: Flowsheet Row Office Visit from 09/02/2019 in [facility]  AUDIT-C Score 1       AAA: n;/a for age The USPSTF recommends one-time screening with ultrasonography in men ages 59 to 36 years who have ever smoked  TKW:IOXB indicated today  Blood pressure/Hypertension: BP Readings from Last 3 Encounters:  10/01/20 124/64  06/12/20 (!) 151/82  02/02/20 (!) 141/84   Weight/Obesity: Wt Readings from Last 3 Encounters:  10/01/20 229 lb 11.2 oz (104.2 kg)  06/12/20 240 lb (108.9 kg)  02/02/20 240 lb (108.9 kg)   BMI Readings from Last 3 Encounters:  10/01/20 27.96 kg/m  06/12/20 30.00 kg/m  02/02/20 30.00 kg/m    Lipids:  Lab Results  Component Value Date   CHOL 169 09/02/2019   CHOL 174 03/19/2019   CHOL 161 11/02/2016   Lab Results  Component Value Date   HDL 36 (L) 09/02/2019   HDL 41 03/19/2019   HDL 43 11/02/2016   Lab Results  Component Value Date   LDLCALC 105 (H) 09/02/2019   LDLCALC 106 (H) 03/19/2019   LDLCALC 97 11/02/2016   Lab Results  Component Value Date   TRIG 160 (H) 09/02/2019   TRIG 158 (H) 03/19/2019   TRIG 105 11/02/2016   Lab Results  Component Value Date   CHOLHDL 4.7 09/02/2019   CHOLHDL 4.2 03/19/2019   CHOLHDL 3.7 11/02/2016   No results found for: LDLDIRECT Based on the results of lipid panel his/her cardiovascular risk factor ( using [place] Cohort )  in the next 10 years is : The 10-year ASCVD risk score Denman George DC Montez Hageman., et al., 2013) is: 7.5%   Values used to calculate the score:     Age: 62 years     Sex: Male     Is Non-Hispanic African American: No     Diabetic: No     Tobacco smoker: No     Systolic Blood Pressure: 124 mmHg     Is BP treated: No     HDL Cholesterol: 36 mg/dL     Total Cholesterol: 169 mg/dL Glucose:  Glucose, Bld  Date Value Ref Range Status  09/02/2019 98 65 - 99 mg/dL Final    Comment:    .            Fasting reference  interval .   03/19/2019  102 (H) 65 - 99 mg/dL Final    Comment:    .            Fasting reference interval . For someone without known diabetes, a glucose value between 100 and 125 mg/dL is consistent with prediabetes and should be confirmed with a follow-up test. .   07/30/2018 115 (H) 70 - 99 mg/dL Final   Glucose-Capillary  Date Value Ref Range Status  10/03/2015 121 (H) 65 - 99 mg/dL Final    Social History      He  reports that he quit smoking about 30 years ago. His smoking use included cigarettes. He has a 18.00 pack-year smoking history. His smokeless tobacco use includes snuff. He reports current alcohol use of about 1.0 standard drink of alcohol per week. He reports that he does not use drugs.       Social History   Socioeconomic History   Marital status: Divorced    Spouse name: Not on file   Number of children: 1   Years of education: Not on file   Highest education level: Not on file  Occupational History   Not on file  Tobacco Use   Smoking status: Former    Packs/day: 1.00    Years: 18.00    Pack years: 18.00    Types: Cigarettes    Quit date: 03/28/1990    Years since quitting: 30.5   Smokeless tobacco: Current    Types: Snuff  Vaping Use   Vaping Use: Never used  Substance and Sexual Activity   Alcohol use: Yes    Alcohol/week: 1.0 standard drink    Types: 1 Cans of beer per week    Comment: occasionally   Drug use: No   Sexual activity: Not Currently  Other Topics Concern   Not on file  Social History Narrative   Not on file   Social Determinants of Health   Financial Resource Strain: Medium Risk   Difficulty of Paying Living Expenses: Somewhat hard  Food Insecurity: Food Insecurity Present   Worried About Running Out of Food in the Last Year: Sometimes true   Ran Out of Food in the Last Year: Sometimes true  Transportation Needs: No Transportation Needs   Lack of Transportation (Medical): No   Lack of Transportation (Non-Medical): No   Physical Activity: Insufficiently Active   Days of Exercise per Week: 3 days   Minutes of Exercise per Session: 20 min  Stress: Stress Concern Present   Feeling of Stress : To some extent  Social Connections: Moderately Integrated   Frequency of Communication with Friends and Family: More than three times a week   Frequency of Social Gatherings with Friends and Family: Three times a week   Attends Religious Services: 1 to 4 times per year   Active Member of Clubs or Organizations: Yes   Attends Engineer, structural: More than 4 times per year   Marital Status: Divorced     Family History        Family Status  Relation Name Status   Father  Alive   Brother  Alive   Mother  Alive   Daughter  Alive   Brother  Alive   Brother  Deceased at age 22       Liver cancer         His family history includes Cancer in his brother and brother; Colon polyps in his father; Heart attack in his father; Heart disease in his father; Hyperlipidemia  in his father; Sleep apnea in his father.       Family History  Problem Relation Age of Onset   Heart disease Father        heart attack age 75   Heart attack Father    Hyperlipidemia Father    Sleep apnea Father    Colon polyps Father    Cancer Brother        hodgins, liver cancer   Cancer Brother     Patient Active Problem List   Diagnosis Date Noted   Chronic pain of left knee 11/19/2017   Pseudogout 11/19/2017   Pain in joint of left hip 11/19/2017   Pain, joint, multiple sites 11/19/2017   DDD (degenerative disc disease), lumbar 11/19/2017   Rheumatoid factor positive 11/14/2017   Generalized osteoarthritis 11/14/2017   Chondrocalcinosis 11/14/2017   PTSD (post-traumatic stress disorder) 10/27/2017   Chronic midline low back pain with left-sided sciatica 10/27/2017   Obstructive sleep apnea 11/02/2016   High triglycerides 09/10/2015   Fatty liver 09/10/2015   Overweight (BMI 25.0-29.9) 11/06/2014   Allergic rhinitis  11/06/2014   Low HDL (under 40)    Chronic anxiety 10/21/2014   Depression 07/15/2014   Agoraphobia without history of panic disorder    Elevated serum glutamic pyruvic transaminase (SGPT) level     Past Surgical History:  Procedure Laterality Date   NASAL SINUS SURGERY  2008     Current Outpatient Medications:    ALLERGY RELIEF 180 MG tablet, Take 1 tablet (180 mg total) by mouth daily as needed for allergies or rhinitis., Disp: 30 tablet, Rfl: 11   azelastine (ASTELIN) 0.1 % nasal spray, Place 2 sprays into both nostrils 2 (two) times daily. Use in each nostril as directed, Disp: 30 mL, Rfl: 11   cholecalciferol (VITAMIN D) 1000 UNITS tablet, Take 1,000 Units by mouth daily., Disp: , Rfl:    HYDROcodone-acetaminophen (NORCO/VICODIN) 5-325 MG tablet, Take 1 tablet by mouth every 4 (four) hours as needed for moderate pain., Disp: 15 tablet, Rfl: 0   meloxicam (MOBIC) 15 MG tablet, take one tablet by mouth daily with a meal, Disp: 6 tablet, Rfl: 0   meloxicam (MOBIC) 7.5 MG tablet, TAKE 1 TABLET DAILY AS NEEDED FOR PAIN - TAKE WITH FOOD, Disp: 30 tablet, Rfl: 5   methocarbamol (ROBAXIN) 500 MG tablet, TAKE ONE TABLET BY MOUTH AT BEDTIME FOR SPASM PAIN, Disp: 14 tablet, Rfl: 0   Multiple Vitamin (MULTIVITAMIN) tablet, Take 1 tablet by mouth daily., Disp: , Rfl:    sertraline (ZOLOFT) 100 MG tablet, Take 1 tablet (100 mg total) by mouth daily., Disp: 90 tablet, Rfl: 3   tadalafil (CIALIS) 20 MG tablet, Take 0.5-1 tablets (10-20 mg total) by mouth every other day as needed for erectile dysfunction., Disp: 10 tablet, Rfl: 3  Allergies  Allergen Reactions   Augmentin [Amoxicillin-Pot Clavulanate] Nausea Only    Patient Care Team: Richard Berry, PA-C as PCP - General (Family Medicine)   Chart Review: I personally reviewed active problem list, medication list, allergies, family history, social history, health maintenance, notes from last encounter, lab results, imaging with the  patient/caregiver today.   Review of Systems  Constitutional: Negative.  Negative for activity change, appetite change, fatigue and unexpected weight change.  HENT: Negative.    Eyes: Negative.   Respiratory: Negative.  Negative for shortness of breath.   Cardiovascular: Negative.  Negative for chest pain, palpitations and leg swelling.  Gastrointestinal: Negative.  Negative for abdominal pain and blood  in stool.  Endocrine: Negative.   Genitourinary: Negative.  Negative for decreased urine volume, difficulty urinating, testicular pain and urgency.  Skin: Negative.  Negative for color change and pallor.  Allergic/Immunologic: Negative.   Neurological: Negative.  Negative for syncope, weakness, light-headedness and numbness.  Psychiatric/Behavioral: Negative.  Negative for confusion, dysphoric mood, self-injury and suicidal ideas. The patient is not nervous/anxious.   All other systems reviewed and are negative.        Objective:   Vitals:  Vitals:   10/01/20 1520  BP: 124/64  Pulse: 86  Resp: 16  Temp: 98.1 F (36.7 C)  SpO2: 98%  Weight: 229 lb 11.2 oz (104.2 kg)  Height: 6\' 4"  (1.93 m)    Body mass index is 27.96 kg/m.  Physical Exam Vitals and nursing note reviewed.  Constitutional:      General: He is not in acute distress.    Appearance: Normal appearance. He is well-developed. He is not ill-appearing, toxic-appearing or diaphoretic.     Interventions: Face mask in place.  HENT:     Head: Normocephalic and atraumatic.     Jaw: No trismus.     Right Ear: External ear normal.     Left Ear: External ear normal.  Eyes:     General: Lids are normal. No scleral icterus.       Right eye: No discharge.        Left eye: No discharge.     Conjunctiva/sclera: Conjunctivae normal.  Neck:     Trachea: Trachea and phonation normal. No tracheal deviation.  Cardiovascular:     Rate and Rhythm: Normal rate and regular rhythm.     Pulses: Normal pulses.          Radial  pulses are 2+ on the right side and 2+ on the left side.       Posterior tibial pulses are 2+ on the right side and 2+ on the left side.     Heart sounds: Normal heart sounds. No murmur heard.   No friction rub. No gallop.  Pulmonary:     Effort: Pulmonary effort is normal. No respiratory distress.     Breath sounds: Normal breath sounds. No stridor. No wheezing, rhonchi or rales.  Abdominal:     General: Bowel sounds are normal. There is no distension.     Palpations: Abdomen is soft.  Musculoskeletal:     Right lower leg: No edema.     Left lower leg: No edema.  Skin:    General: Skin is warm and dry.     Coloration: Skin is not jaundiced.     Findings: No rash.     Nails: There is no clubbing.  Neurological:     Mental Status: He is alert. Mental status is at baseline.     Cranial Nerves: No dysarthria or facial asymmetry.     Motor: No tremor or abnormal muscle tone.     Gait: Gait normal.  Psychiatric:        Mood and Affect: Mood normal.        Speech: Speech normal.        Behavior: Behavior normal. Behavior is cooperative.     No results found for this or any previous visit (from the past 2160 hour(s)).  Diabetic Foot Exam: Diabetic Foot Exam - Simple   No data filed     Fall Risk: Fall Risk  10/01/2020 09/02/2019 03/19/2019 02/01/2019 10/19/2017  Falls in the past year? 0 0 0 0  No  Comment - - - - -  Number falls in past yr: 0 0 0 0 -  Injury with Fall? 0 0 0 0 -  Follow up - - Falls evaluation completed Falls evaluation completed -    Functional Status Survey: Is the patient deaf or have difficulty hearing?: No Does the patient have difficulty seeing, even when wearing glasses/contacts?: No Does the patient have difficulty concentrating, remembering, or making decisions?: No Does the patient have difficulty walking or climbing stairs?: No Does the patient have difficulty dressing or bathing?: No Does the patient have difficulty doing errands alone such as  visiting a doctor's office or shopping?: No   Assessment & Plan:    CPE completed today  Prostate cancer screening and PSA options (with potential risks and benefits of testing vs not testing) were discussed along with recent recs/guidelines, shared decision making and handout/information given to pt today  USPSTF grade A and B recommendations reviewed with patient; age-appropriate recommendations, preventive care, screening tests, etc discussed and encouraged; healthy living encouraged; see AVS for patient education given to patient  Discussed importance of 150 minutes of physical activity weekly, AHA exercise recommendations given to pt in AVS/handout  Discussed importance of healthy diet:  eating lean meats and proteins, avoiding trans fats and saturated fats, avoid simple sugars and excessive carbs in diet, eat 6 servings of fruit/vegetables daily and drink plenty of water and avoid sweet beverages.  DASH diet reviewed if pt has HTN  Recommended pt to do annual eye exam and routine dental exams/cleanings  Reviewed Health Maintenance: Health Maintenance  Topic Date Due   COVID-19 Vaccine (1) Never done   Fecal DNA (Cologuard)  Never done   Zoster Vaccines- Shingrix (1 of 2) 01/01/2021 (Originally 04/08/2012)   INFLUENZA VACCINE  10/26/2020   TETANUS/TDAP  11/04/2020   Hepatitis C Screening  Completed   HIV Screening  Completed   Pneumococcal Vaccine 66-55 Years old  Aged Out   HPV VACCINES  Aged Out    Immunizations: Immunization History  Administered Date(s) Administered   Td 06/29/2006     ICD-10-CM   1. Annual physical exam  Z00.00 COMPLETE METABOLIC PANEL WITH GFR    CBC w/Diff/Platelet    Lipid panel    PSA    2. Screening for prostate cancer  Z12.5 PSA    3. Screening for colon cancer  Z12.11 Cologuard    4. Hyperlipidemia, unspecified hyperlipidemia type  E78.5 Lipid panel   not on meds, ASCVD was 7.5% and weight down since last labs, repeat labs and recalculate  ASCVD, discussed indication for statin    5. PTSD (post-traumatic stress disorder)  F43.10    chronic anxiety, ptsd much better with job change, he weaned off zoloft last year, doing well    6. Need for shingles vaccine  Z23 Zoster Vaccine Adjuvanted [facility]) injection        Richard Berry, PA-C 10/01/20 3:40 PM  [facility]

## 2020-10-02 LAB — CBC WITH DIFFERENTIAL/PLATELET
Absolute Monocytes: 592 cells/uL (ref 200–950)
Basophils Absolute: 52 cells/uL (ref 0–200)
Basophils Relative: 0.7 %
Eosinophils Absolute: 178 cells/uL (ref 15–500)
Eosinophils Relative: 2.4 %
HCT: 45.2 % (ref 38.5–50.0)
Hemoglobin: 15.4 g/dL (ref 13.2–17.1)
Lymphs Abs: 2139 cells/uL (ref 850–3900)
MCH: 30 pg (ref 27.0–33.0)
MCHC: 34.1 g/dL (ref 32.0–36.0)
MCV: 87.9 fL (ref 80.0–100.0)
MPV: 9.9 fL (ref 7.5–12.5)
Monocytes Relative: 8 %
Neutro Abs: 4440 cells/uL (ref 1500–7800)
Neutrophils Relative %: 60 %
Platelets: 189 10*3/uL (ref 140–400)
RBC: 5.14 10*6/uL (ref 4.20–5.80)
RDW: 12.8 % (ref 11.0–15.0)
Total Lymphocyte: 28.9 %
WBC: 7.4 10*3/uL (ref 3.8–10.8)

## 2020-10-02 LAB — LIPID PANEL
Cholesterol: 140 mg/dL (ref <200)
HDL: 40 mg/dL (ref >=40)
LDL Cholesterol (Calc): 77 mg/dL (calc)
Non-HDL Cholesterol (Calc): 100 mg/dL (calc) (ref <130)
Total CHOL/HDL Ratio: 3.5 (calc) (ref <5.0)
Triglycerides: 129 mg/dL (ref <150)

## 2020-10-02 LAB — COMPLETE METABOLIC PANEL WITH GFR
AG Ratio: 1.4 (calc) (ref 1.0–2.5)
ALT: 38 U/L (ref 9–46)
AST: 29 U/L (ref 10–35)
Albumin: 4.6 g/dL (ref 3.6–5.1)
Alkaline phosphatase (APISO): 57 U/L (ref 35–144)
BUN: 12 mg/dL (ref 7–25)
CO2: 26 mmol/L (ref 20–32)
Calcium: 9.7 mg/dL (ref 8.6–10.3)
Chloride: 104 mmol/L (ref 98–110)
Creat: 1.11 mg/dL (ref 0.70–1.33)
GFR, Est African American: 84 mL/min/{1.73_m2} (ref >=60)
GFR, Est Non African American: 73 mL/min/{1.73_m2} (ref >=60)
Globulin: 3.3 g/dL (calc) (ref 1.9–3.7)
Glucose, Bld: 106 mg/dL — ABNORMAL HIGH (ref 65–99)
Potassium: 3.9 mmol/L (ref 3.5–5.3)
Sodium: 140 mmol/L (ref 135–146)
Total Bilirubin: 0.9 mg/dL (ref 0.2–1.2)
Total Protein: 7.9 g/dL (ref 6.1–8.1)

## 2020-10-02 LAB — PSA: PSA: 0.69 ng/mL (ref <=4.00)

## 2020-10-19 ENCOUNTER — Other Ambulatory Visit: Payer: Self-pay | Admitting: Family Medicine

## 2020-10-22 ENCOUNTER — Other Ambulatory Visit: Payer: Self-pay | Admitting: Family Medicine

## 2020-10-22 MED ORDER — FEXOFENADINE HCL 180 MG PO TABS: ORAL_TABLET | ORAL | 11 refills | Status: DC

## 2020-10-22 NOTE — Telephone Encounter (Signed)
  Notes to clinic:  Patient is requesting refill on this medication Looks like it was refused     Requested Prescriptions  Pending Prescriptions Disp Refills   fexofenadine (ALLERGY RELIEF) 180 MG tablet 30 tablet 11    Sig: Take 1 tablet (180 mg total) by mouth daily as needed for allergies or rhinitis.      Ear, Nose, and Throat:  Antihistamines Passed - 10/22/2020  3:06 PM      Passed - Valid encounter within last 12 months    Recent Outpatient Visits           3 weeks ago Annual physical exam   [facility] Danelle Berry, PA-C   1 year ago Mild episode of recurrent major depressive disorder [facility])   [facility] Danelle Berry, PA-C   1 year ago Annual physical exam   [facility] Doren Custard, FNP   1 year ago Obstructive sleep apnea   [facility] Doren Custard, FNP   2 years ago Chronic pain of left knee   [name], Janit Bern, MD       Future Appointments             In 11 months Danelle Berry, PA-C [facility], [facility]

## 2020-10-22 NOTE — Telephone Encounter (Signed)
Pts next appt is 09/17/21

## 2020-10-22 NOTE — Telephone Encounter (Signed)
Medication Refill - Medication:   ALLERGY RELIEF 180 MG tablet   Has the patient contacted their pharmacy? Yes.  No refills left.    Preferred Pharmacy (with phone number or street name):   [facility] - [place], [place] - [address]  [address] [place] [address]  Phone: [phone] Fax: [phone]    Agent: Please be advised that RX refills may take up to 3 business days. We ask that you follow-up with your pharmacy.

## 2020-12-03 ENCOUNTER — Emergency Department: Payer: No Typology Code available for payment source

## 2020-12-03 ENCOUNTER — Emergency Department
Admission: EM | Admit: 2020-12-03 | Discharge: 2020-12-03 | Disposition: A | Discharge status: Home / Self Care | Payer: No Typology Code available for payment source | Attending: Student in an Organized Health Care Education/Training Program | Admitting: Student in an Organized Health Care Education/Training Program

## 2020-12-03 ENCOUNTER — Other Ambulatory Visit: Payer: Self-pay

## 2020-12-03 DIAGNOSIS — R109 Unspecified abdominal pain: Secondary | ICD-10-CM | POA: Diagnosis present

## 2020-12-03 DIAGNOSIS — I10 Essential (primary) hypertension: Secondary | ICD-10-CM | POA: Diagnosis not present

## 2020-12-03 DIAGNOSIS — Z87891 Personal history of nicotine dependence: Secondary | ICD-10-CM | POA: Diagnosis not present

## 2020-12-03 DIAGNOSIS — N23 Unspecified renal colic: Secondary | ICD-10-CM | POA: Diagnosis not present

## 2020-12-03 LAB — CBC
HCT: 44.8 % (ref 39.0–52.0)
Hemoglobin: 15.5 g/dL (ref 13.0–17.0)
MCH: 30.2 pg (ref 26.0–34.0)
MCHC: 34.6 g/dL (ref 30.0–36.0)
MCV: 87.3 fL (ref 80.0–100.0)
Platelets: 188 10*3/uL (ref 150–400)
RBC: 5.13 MIL/uL (ref 4.22–5.81)
RDW: 13.1 % (ref 11.5–15.5)
WBC: 10.8 10*3/uL — ABNORMAL HIGH (ref 4.0–10.5)
nRBC: 0 % (ref 0.0–0.2)

## 2020-12-03 LAB — URINALYSIS, COMPLETE (UACMP) WITH MICROSCOPIC
Bilirubin Urine: NEGATIVE
Glucose, UA: NEGATIVE mg/dL
Ketones, ur: NEGATIVE mg/dL
Nitrite: NEGATIVE
RBC / HPF: >50 RBC/hpf (ref 0–5)
Specific Gravity, Urine: 1.025 (ref 1.005–1.030)
pH: 5 (ref 5.0–8.0)

## 2020-12-03 LAB — BASIC METABOLIC PANEL
Anion gap: 11 (ref 5–15)
BUN: 13 mg/dL (ref 6–20)
CO2: 25 mmol/L (ref 22–32)
Calcium: 9.5 mg/dL (ref 8.9–10.3)
Chloride: 101 mmol/L (ref 98–111)
Creatinine, Ser: 1.02 mg/dL (ref 0.61–1.24)
GFR, Estimated: >60 mL/min (ref >60)
Glucose, Bld: 138 mg/dL — ABNORMAL HIGH (ref 70–99)
Potassium: 3.9 mmol/L (ref 3.5–5.1)
Sodium: 137 mmol/L (ref 135–145)

## 2020-12-03 MED ORDER — KETOROLAC TROMETHAMINE 30 MG/ML IJ SOLN
30.0000 mg | Freq: Once | INTRAMUSCULAR | Status: AC
  Administered 2020-12-03: 30 mg via INTRAMUSCULAR
  Filled 2020-12-03: qty 1

## 2020-12-03 MED ORDER — OXYCODONE-ACETAMINOPHEN 5-325 MG PO TABS
1.0000 | ORAL_TABLET | Freq: Once | ORAL | Status: AC
  Administered 2020-12-03: 1 via ORAL
  Filled 2020-12-03: qty 1

## 2020-12-03 MED ORDER — TAMSULOSIN HCL 0.4 MG PO CAPS: 0.4000 mg | ORAL_CAPSULE | Freq: Every day | ORAL | 0 refills | Status: DC

## 2020-12-03 MED ORDER — ONDANSETRON 4 MG PO TBDP: 4.0000 mg | ORAL_TABLET | Freq: Three times a day (TID) | ORAL | 0 refills | Status: DC | PRN

## 2020-12-03 MED ORDER — OXYCODONE-ACETAMINOPHEN 5-325 MG PO TABS: 1.0000 | ORAL_TABLET | ORAL | 0 refills | Status: DC | PRN

## 2020-12-03 MED ORDER — ONDANSETRON 4 MG PO TBDP
4.0000 mg | ORAL_TABLET | Freq: Once | ORAL | Status: AC
  Administered 2020-12-03: 4 mg via ORAL
  Filled 2020-12-03: qty 1

## 2020-12-03 NOTE — ED Provider Notes (Signed)
[facility] Emergency Department Provider Note    Event Date/Time   First MD Initiated Contact with Patient 12/03/20 1101     (approximate)  I have reviewed the triage vital signs and the nursing notes.   HISTORY  Chief Complaint Flank Pain    HPI Richard [name]. is a 60 y.o. male with the below listed past medical history presents to the ER for evaluation of left flank pain that started last night.  Has a history of kidney stones and feels it similar to that.  Has had some nausea no measured fevers denies any dysuria.  Not on any blood thinners.  Past Medical History:  Diagnosis Date   Agoraphobia without history of panic disorder    Anxiety    Depression    Dizziness    Elevated serum glutamic pyruvic transaminase (SGPT) level    Fatty liver 09/10/2015   High triglycerides 09/10/2015   Hypertension    IFG (impaired fasting glucose)    Kidney stone    Low HDL (under 40)    Obstructive sleep apnea 11/02/2016   Overweight (BMI 25.0-29.9) 11/06/2014   Snoring 09/10/2015   Vitamin D deficiency    Family History  Problem Relation Age of Onset   Heart disease Father        heart attack age 30   Heart attack Father    Hyperlipidemia Father    Sleep apnea Father    Colon polyps Father    Cancer Brother        hodgins, liver cancer   Cancer Brother    Past Surgical History:  Procedure Laterality Date   NASAL SINUS SURGERY  2008   Patient Active Problem List   Diagnosis Date Noted   Chronic pain of left knee 11/19/2017   Pseudogout 11/19/2017   Pain in joint of left hip 11/19/2017   Pain, joint, multiple sites 11/19/2017   DDD (degenerative disc disease), lumbar 11/19/2017   Rheumatoid factor positive 11/14/2017   Generalized osteoarthritis 11/14/2017   Chondrocalcinosis 11/14/2017   PTSD (post-traumatic stress disorder) 10/27/2017   Chronic midline low back pain with left-sided sciatica 10/27/2017   Obstructive sleep apnea 11/02/2016    High triglycerides 09/10/2015   Fatty liver 09/10/2015   Overweight (BMI 25.0-29.9) 11/06/2014   Allergic rhinitis 11/06/2014   Low HDL (under 40)    Chronic anxiety 10/21/2014   Depression 07/15/2014   Agoraphobia without history of panic disorder    Elevated serum glutamic pyruvic transaminase (SGPT) level       Prior to Admission medications   Medication Sig Start Date End Date Taking? Authorizing Provider  ondansetron (ZOFRAN ODT) 4 MG disintegrating tablet Take 1 tablet (4 mg total) by mouth every 8 (eight) hours as needed for nausea or vomiting. 12/03/20  Yes Willy Eddy, MD  oxyCODONE-acetaminophen (PERCOCET) 5-325 MG tablet Take 1 tablet by mouth every 4 (four) hours as needed for severe pain. 12/03/20 12/03/21 Yes Willy Eddy, MD  tamsulosin (FLOMAX) 0.4 MG CAPS capsule Take 1 capsule (0.4 mg total) by mouth daily after supper. 12/03/20  Yes Willy Eddy, MD  azelastine (ASTELIN) 0.1 % nasal spray Place 2 sprays into both nostrils 2 (two) times daily. Use in each nostril as directed 04/24/20   Danelle Berry, PA-C  cholecalciferol (VITAMIN D) 1000 UNITS tablet Take 1,000 Units by mouth daily.    [provider]  fexofenadine (ALLERGY RELIEF) 180 MG tablet Take 1 tablet (180 mg total) by mouth daily as  needed for allergies or rhinitis. 10/22/20   Danelle Berryapia, Leisa, PA-C  meloxicam (MOBIC) 7.5 MG tablet TAKE 1 TABLET DAILY AS NEEDED FOR PAIN - TAKE WITH FOOD 09/12/19   Danelle Berryapia, Leisa, PA-C  methocarbamol (ROBAXIN) 500 MG tablet TAKE ONE TABLET BY MOUTH AT BEDTIME FOR SPASM PAIN 07/10/20   Ivery QualeBryant, Hobson, PA-C  Multiple Vitamin (MULTIVITAMIN) tablet Take 1 tablet by mouth daily.    [provider]  tadalafil (CIALIS) 20 MG tablet Take 0.5-1 tablets (10-20 mg total) by mouth every other day as needed for erectile dysfunction. 03/26/19   Doren CustardBoyce, Emily E, FNP    Allergies Augmentin [amoxicillin-pot clavulanate]    Social History Social History   Tobacco Use    Smoking status: Former    Packs/day: 1.00    Years: 18.00    Pack years: 18.00    Types: Cigarettes    Quit date: 03/28/1990    Years since quitting: 30.7   Smokeless tobacco: Current    Types: Snuff  Vaping Use   Vaping Use: Never used  Substance Use Topics   Alcohol use: Yes    Alcohol/week: 1.0 standard drink    Types: 1 Cans of beer per week    Comment: occasionally   Drug use: No    Review of Systems Patient denies headaches, rhinorrhea, blurry vision, numbness, shortness of breath, chest pain, edema, cough, abdominal pain, nausea, vomiting, diarrhea, dysuria, fevers, rashes or hallucinations unless otherwise stated above in HPI. ____________________________________________   PHYSICAL EXAM:  VITAL SIGNS: Vitals:   12/03/20 0959 12/03/20 1230  BP: 135/85 113/70  Pulse: 81 (!) 59  Resp: 16   Temp: 98 F (36.7 C)   SpO2: 97% 96%    Constitutional: Alert and oriented.  Eyes: Conjunctivae are normal.  Head: Atraumatic. Nose: No congestion/rhinnorhea. Mouth/Throat: Mucous membranes are moist.   Neck: No stridor. Painless ROM.  Cardiovascular: Normal rate, regular rhythm. Grossly normal heart sounds.  Good peripheral circulation. Respiratory: Normal respiratory effort.  No retractions. Lungs CTAB. Gastrointestinal: Soft and nontender. No distention. No abdominal bruits. No CVA tenderness. Genitourinary:  Musculoskeletal: No lower extremity tenderness nor edema.  No joint effusions. Neurologic:  Normal speech and language. No gross focal neurologic deficits are appreciated. No facial droop Skin:  Skin is warm, dry and intact. No rash noted. Psychiatric: Mood and affect are normal. Speech and behavior are normal.  ____________________________________________   LABS (all labs ordered are listed, but only abnormal results are displayed)  Results for orders placed or performed during the hospital encounter of 12/03/20 (from the past 24 hour(s))  Urinalysis, Complete w  Microscopic     Status: Abnormal   Collection Time: 12/03/20  9:58 AM  Result Value Ref Range   Color, Urine YELLOW YELLOW   APPearance CLEAR CLEAR   Specific Gravity, Urine 1.025 1.005 - 1.030   pH 5.0 5.0 - 8.0   Glucose, UA NEGATIVE NEGATIVE mg/dL   Hgb urine dipstick LARGE (A) NEGATIVE   Bilirubin Urine NEGATIVE NEGATIVE   Ketones, ur NEGATIVE NEGATIVE mg/dL   Protein, ur TRACE (A) NEGATIVE mg/dL   Nitrite NEGATIVE NEGATIVE   Leukocytes,Ua TRACE (A) NEGATIVE   Squamous Epithelial / LPF 0-5 0 - 5   WBC, UA 11-20 0 - 5 WBC/hpf   RBC / HPF >50 0 - 5 RBC/hpf   Bacteria, UA FEW (A) NONE SEEN   Mucus PRESENT   CBC     Status: Abnormal   Collection Time: 12/03/20  9:58 AM  Result  Value Ref Range   WBC 10.8 (H) 4.0 - 10.5 K/uL   RBC 5.13 4.22 - 5.81 MIL/uL   Hemoglobin 15.5 13.0 - 17.0 g/dL   HCT 96.7 89.3 - 81.0 %   MCV 87.3 80.0 - 100.0 fL   MCH 30.2 26.0 - 34.0 pg   MCHC 34.6 30.0 - 36.0 g/dL   RDW 17.5 10.2 - 58.5 %   Platelets 188 150 - 400 K/uL   nRBC 0.0 0.0 - 0.2 %  Basic metabolic panel     Status: Abnormal   Collection Time: 12/03/20  9:58 AM  Result Value Ref Range   Sodium 137 135 - 145 mmol/L   Potassium 3.9 3.5 - 5.1 mmol/L   Chloride 101 98 - 111 mmol/L   CO2 25 22 - 32 mmol/L   Glucose, Bld 138 (H) 70 - 99 mg/dL   BUN 13 6 - 20 mg/dL   Creatinine, Ser 2.77 0.61 - 1.24 mg/dL   Calcium 9.5 8.9 - 82.4 mg/dL   GFR, Estimated >23 >53 mL/min   Anion gap 11 5 - 15   ____________________________________________  EKG____________________________________________  RADIOLOGY  I personally reviewed all radiographic images ordered to evaluate for the above acute complaints and reviewed radiology reports and findings.  These findings were personally discussed with the patient.  Please see medical record for radiology report.  ____________________________________________   PROCEDURES  Procedure(s) performed:  Procedures    Critical Care performed:  no ____________________________________________   INITIAL IMPRESSION / ASSESSMENT AND PLAN / ED COURSE  Pertinent labs & imaging results that were available during my care of the patient were reviewed by me and considered in my medical decision making (see chart for details).   DDX: stone, pyelo, msk strain, AAA, colitis  Guadalupe Nickless. is a 60 y.o. who presents to the ED with presentation as described above.  Patient with work-up CT imaging and blood work consistent with ureteral stone.  Does not seem consistent with infected stone.  Pyelonephritis.  Pain well controlled is afebrile hemodynamically stable.  Discussed case in consultation with Dr. Irish Elders of urology who will see patient in clinic.  Patient given Toradol.  Pain controlled.  Demonstrates understanding of strict return precautions.  Patient agreeable plan.     The patient was evaluated in Emergency Department today for the symptoms described in the history of present illness. He/she was evaluated in the context of the global COVID-19 pandemic, which necessitated consideration that the patient might be at risk for infection with the SARS-CoV-2 virus that causes COVID-19. Institutional protocols and algorithms that pertain to the evaluation of patients at risk for COVID-19 are in a state of rapid change based on information released by regulatory bodies including the CDC and federal and state organizations. These policies and algorithms were followed during the patient's care in the ED.  As part of my medical decision making, I reviewed the following data within the electronic MEDICAL RECORD NUMBER Nursing notes reviewed and incorporated, Labs reviewed, notes from prior ED visits and [place] Controlled Substance Database   ____________________________________________   FINAL CLINICAL IMPRESSION(S) / ED DIAGNOSES  Final diagnoses:  Left flank pain  Ureteral colic      NEW MEDICATIONS STARTED DURING THIS VISIT:  New Prescriptions    ONDANSETRON (ZOFRAN ODT) 4 MG DISINTEGRATING TABLET    Take 1 tablet (4 mg total) by mouth every 8 (eight) hours as needed for nausea or vomiting.   OXYCODONE-ACETAMINOPHEN (PERCOCET) 5-325 MG TABLET  Take 1 tablet by mouth every 4 (four) hours as needed for severe pain.   TAMSULOSIN (FLOMAX) 0.4 MG CAPS CAPSULE    Take 1 capsule (0.4 mg total) by mouth daily after supper.     Note:  This document was prepared using Dragon voice recognition software and may include unintentional dictation errors.    Willy Eddy, MD 12/03/20 1309

## 2020-12-03 NOTE — ED Triage Notes (Signed)
Pt to ED for left flank pain with n/v that started yesterday  Denies hematuria Hx kidney stones

## 2020-12-04 ENCOUNTER — Telehealth: Payer: Self-pay | Admitting: Urology

## 2020-12-04 LAB — URINE CULTURE: Culture: <10000 — AB

## 2020-12-04 NOTE — Telephone Encounter (Signed)
LMOM to offer pt appt on Monday per [facility]

## 2021-01-04 ENCOUNTER — Other Ambulatory Visit: Payer: Self-pay | Admitting: Family Medicine

## 2021-01-04 DIAGNOSIS — G8929 Other chronic pain: Secondary | ICD-10-CM

## 2021-07-06 ENCOUNTER — Other Ambulatory Visit: Payer: Self-pay | Admitting: Family Medicine

## 2021-07-06 DIAGNOSIS — J309 Allergic rhinitis, unspecified: Secondary | ICD-10-CM

## 2021-08-05 ENCOUNTER — Other Ambulatory Visit: Payer: Self-pay | Admitting: Family Medicine

## 2021-08-05 DIAGNOSIS — J309 Allergic rhinitis, unspecified: Secondary | ICD-10-CM

## 2021-08-05 DIAGNOSIS — G8929 Other chronic pain: Secondary | ICD-10-CM

## 2021-09-09 ENCOUNTER — Other Ambulatory Visit: Payer: Self-pay | Admitting: Family Medicine

## 2021-09-09 DIAGNOSIS — J309 Allergic rhinitis, unspecified: Secondary | ICD-10-CM

## 2021-09-20 ENCOUNTER — Encounter: Payer: No Typology Code available for payment source | Admitting: Family Medicine

## 2021-10-19 ENCOUNTER — Other Ambulatory Visit: Payer: Self-pay | Admitting: Family Medicine

## 2021-10-19 DIAGNOSIS — J309 Allergic rhinitis, unspecified: Secondary | ICD-10-CM

## 2021-10-21 ENCOUNTER — Encounter: Payer: No Typology Code available for payment source | Admitting: Family Medicine

## 2021-10-28 ENCOUNTER — Encounter: Payer: No Typology Code available for payment source | Admitting: Family Medicine

## 2021-11-09 ENCOUNTER — Encounter: Payer: Self-pay | Admitting: Internal Medicine

## 2021-11-09 ENCOUNTER — Ambulatory Visit (INDEPENDENT_AMBULATORY_CARE_PROVIDER_SITE_OTHER): Payer: No Typology Code available for payment source | Admitting: Internal Medicine

## 2021-11-09 VITALS — BP 122/78 | HR 81 | Temp 98.0°F | Resp 16 | Ht 76.0 in | Wt 227.3 lb

## 2021-11-09 DIAGNOSIS — E785 Hyperlipidemia, unspecified: Secondary | ICD-10-CM | POA: Diagnosis not present

## 2021-11-09 DIAGNOSIS — Z125 Encounter for screening for malignant neoplasm of prostate: Secondary | ICD-10-CM | POA: Diagnosis not present

## 2021-11-09 DIAGNOSIS — Z0000 Encounter for general adult medical examination without abnormal findings: Secondary | ICD-10-CM | POA: Diagnosis not present

## 2021-11-09 DIAGNOSIS — R7303 Prediabetes: Secondary | ICD-10-CM

## 2021-11-09 DIAGNOSIS — Z23 Encounter for immunization: Secondary | ICD-10-CM | POA: Diagnosis not present

## 2021-11-09 DIAGNOSIS — Z1211 Encounter for screening for malignant neoplasm of colon: Secondary | ICD-10-CM

## 2021-11-09 NOTE — Progress Notes (Signed)
Name: Richard LeatherwoodJames L Gladwell Jr.   MRN: [id number]    DOB: [birth year]   Date:11/09/2021       Progress Note  Subjective  Chief Complaint  Chief Complaint  Patient presents with   Annual Exam    HPI  Patient presents for annual CPE.  IPSS Questionnaire (AUA-7): Over the past month.   1)  How often have you had a sensation of not emptying your bladder completely after you finish urinating?  0 - Not at all  2)  How often have you had to urinate again less than two hours after you finished urinating? 1 - Less than 1 time in 5  3)  How often have you found you stopped and started again several times when you urinated?  0 - Not at all  4) How difficult have you found it to postpone urination?  2 - Less than half the time  5) How often have you had a weak urinary stream?  0 - Not at all  6) How often have you had to push or strain to begin urination?  0 - Not at all  7) How many times did you most typically get up to urinate from the time you went to bed until the time you got up in the morning?  1 - 1 time  Total score:  0-7 mildly symptomatic   8-19 moderately symptomatic   20-35 severely symptomatic    Diet: well rounded  Exercise: walks, lifting weights Last Dental Exam: uncertain, does not have a regular dentist  Last Eye Exam: no, no corrective lenses   Depression: phq 9 is negative    11/09/2021    1:04 PM 10/01/2020    3:21 PM 09/02/2019    2:37 PM 03/19/2019    9:34 AM 02/01/2019    4:10 PM  Depression screen PHQ 2/9  Decreased Interest 0 0 0 1 1  Down, Depressed, Hopeless 0 0 0 1 1  PHQ - 2 Score 0 0 0 2 2  Altered sleeping 0 0 1 1 1   Tired, decreased energy 0 0 1 1 1   Change in appetite 0 0 0 0 0  Feeling bad or failure about yourself  0 0 0 0 0  Trouble concentrating 0 0 0 0 0  Moving slowly or fidgety/restless 0 0 0 0 0  Suicidal thoughts 0 0 0 0 0  PHQ-9 Score 0 0 2 4 4   Difficult doing work/chores Not difficult at all Not difficult at all Not difficult at all  Not  difficult at all    Hypertension:  BP Readings from Last 3 Encounters:  11/09/21 122/78  12/03/20 120/79  10/01/20 124/64    Obesity: Wt Readings from Last 3 Encounters:  11/09/21 227 lb 4.8 oz (103.1 kg)  12/03/20 229 lb (103.9 kg)  10/01/20 229 lb 11.2 oz (104.2 kg)   BMI Readings from Last 3 Encounters:  11/09/21 27.67 kg/m  12/03/20 27.87 kg/m  10/01/20 27.96 kg/m     Lipids:  Lab Results  Component Value Date   CHOL 140 10/01/2020   CHOL 169 09/02/2019   CHOL 174 03/19/2019   Lab Results  Component Value Date   HDL 40 10/01/2020   HDL 36 (L) 09/02/2019   HDL 41 03/19/2019   Lab Results  Component Value Date   LDLCALC 77 10/01/2020   LDLCALC 105 (H) 09/02/2019   LDLCALC 106 (H) 03/19/2019   Lab Results  Component Value Date   TRIG 129  10/01/2020   TRIG 160 (H) 09/02/2019   TRIG 158 (H) 03/19/2019   Lab Results  Component Value Date   CHOLHDL 3.5 10/01/2020   CHOLHDL 4.7 09/02/2019   CHOLHDL 4.2 03/19/2019   No results found for: "LDLDIRECT" Glucose:  Glucose, Bld  Date Value Ref Range Status  12/03/2020 138 (H) 70 - 99 mg/dL Final    Comment:    Glucose reference range applies only to samples taken after fasting for at least 8 hours.  10/01/2020 106 (H) 65 - 99 mg/dL Final    Comment:    .            Fasting reference interval . For someone without known diabetes, a glucose value between 100 and 125 mg/dL is consistent with prediabetes and should be confirmed with a follow-up test. .   09/02/2019 98 65 - 99 mg/dL Final    Comment:    .            Fasting reference interval .    Glucose-Capillary  Date Value Ref Range Status  10/03/2015 121 (H) 65 - 99 mg/dL Final    Flowsheet Row Office Visit from 09/02/2019 in [facility]  AUDIT-C Score 1      Married STD testing and prevention (HIV/chl/gon/syphilis):  no concerns  Hep C Screening: 2017 Skin cancer: Discussed monitoring for atypical  lesions Colorectal cancer: Due Prostate cancer:  yes Lab Results  Component Value Date   PSA 0.69 10/01/2020   PSA 0.7 03/19/2019    Lung cancer:  Low Dose CT Chest recommended if Age 59-80 years, 30 pack-year currently smoking OR have quit w/in 15years. Patient  no a candidate for screening   AAA: The USPSTF recommends one-time screening with ultrasonography in men ages 59 to 75 years who have ever smoked. Patient   no, a candidate for screening  ECG:  10/04/2015  Vaccines:   HPV: n/a Tdap: Due  Shingrix: Due Pneumonia: N/a Flu: UTD COVID-19: 2 doses   Advanced Care Planning: A voluntary discussion about advance care planning including the explanation and discussion of advance directives.  Discussed health care proxy and Living will, and the patient was able to identify a health care proxy as wife Gaylen Venning.  Patient does have a living will and power of attorney of health care.   Patient Active Problem List   Diagnosis Date Noted   Chronic pain of left knee 11/19/2017   Pseudogout 11/19/2017   Pain in joint of left hip 11/19/2017   Pain, joint, multiple sites 11/19/2017   DDD (degenerative disc disease), lumbar 11/19/2017   Rheumatoid factor positive 11/14/2017   Generalized osteoarthritis 11/14/2017   Chondrocalcinosis 11/14/2017   PTSD (post-traumatic stress disorder) 10/27/2017   Chronic midline low back pain with left-sided sciatica 10/27/2017   Obstructive sleep apnea 11/02/2016   High triglycerides 09/10/2015   Fatty liver 09/10/2015   Overweight (BMI 25.0-29.9) 11/06/2014   Allergic rhinitis 11/06/2014   Low HDL (under 40)    Chronic anxiety 10/21/2014   Depression 07/15/2014   Agoraphobia without history of panic disorder    Elevated serum glutamic pyruvic transaminase (SGPT) level     Past Surgical History:  Procedure Laterality Date   NASAL SINUS SURGERY  2008    Family History  Problem Relation Age of Onset   Heart disease Father        heart attack  age 69   Heart attack Father    Hyperlipidemia Father  Sleep apnea Father    Colon polyps Father    Cancer Brother        hodgins, liver cancer   Cancer Brother     Social History   Socioeconomic History   Marital status: Married    Spouse name: Not on file   Number of children: 1   Years of education: Not on file   Highest education level: Not on file  Occupational History   Not on file  Tobacco Use   Smoking status: Former    Packs/day: 1.00    Years: 18.00    Total pack years: 18.00    Types: Cigarettes    Quit date: 03/28/1990    Years since quitting: 31.6   Smokeless tobacco: Current    Types: Snuff  Vaping Use   Vaping Use: Never used  Substance and Sexual Activity   Alcohol use: Yes    Alcohol/week: 1.0 standard drink of alcohol    Types: 1 Cans of beer per week    Comment: occasionally   Drug use: No   Sexual activity: Not Currently  Other Topics Concern   Not on file  Social History Narrative   Not on file   Social Determinants of Health   Financial Resource Strain: Low Risk  (11/09/2021)   Overall Financial Resource Strain (CARDIA)    Difficulty of Paying Living Expenses: Not hard at all  Food Insecurity: No Food Insecurity (11/09/2021)   Hunger Vital Sign    Worried About Running Out of Food in the Last Year: Never true    Ran Out of Food in the Last Year: Never true  Transportation Needs: No Transportation Needs (11/09/2021)   PRAPARE - Administrator, Civil Service (Medical): No    Lack of Transportation (Non-Medical): No  Physical Activity: Insufficiently Active (11/09/2021)   Exercise Vital Sign    Days of Exercise per Week: 2 days    Minutes of Exercise per Session: 10 min  Stress: No Stress Concern Present (11/09/2021)   [employer] of Occupational Health - Occupational Stress Questionnaire    Feeling of Stress : Only a little  Social Connections: Moderately Isolated (11/09/2021)   Social Connection and Isolation Panel  [NHANES]    Frequency of Communication with Friends and Family: Twice a week    Frequency of Social Gatherings with Friends and Family: Twice a week    Attends Religious Services: Never    Database administrator or Organizations: No    Attends Banker Meetings: Never    Marital Status: Married  Catering manager Violence: Not At Risk (11/09/2021)   Humiliation, Afraid, Rape, and Kick questionnaire    Fear of Current or Ex-Partner: No    Emotionally Abused: No    Physically Abused: No    Sexually Abused: No     Current Outpatient Medications:    azelastine (ASTELIN) 0.1 % nasal spray, Place 2 sprays into both nostrils 2 (two) times daily. Use in each nostril as directed, Disp: 30 mL, Rfl: 0   cholecalciferol (VITAMIN D) 1000 UNITS tablet, Take 1,000 Units by mouth daily., Disp: , Rfl:    fexofenadine (ALLERGY RELIEF) 180 MG tablet, Take 1 tablet (180 mg total) by mouth daily as needed for allergies or rhinitis., Disp: 30 tablet, Rfl: 11   meloxicam (MOBIC) 7.5 MG tablet, TAKE 1 TABLET DAILY AS NEEDED FOR PAIN - TAKE WITH FOOD, Disp: 90 tablet, Rfl: 1   methocarbamol (ROBAXIN) 500 MG tablet, TAKE ONE  TABLET BY MOUTH AT BEDTIME FOR SPASM PAIN, Disp: 14 tablet, Rfl: 0   Multiple Vitamin (MULTIVITAMIN) tablet, Take 1 tablet by mouth daily., Disp: , Rfl:    ondansetron (ZOFRAN ODT) 4 MG disintegrating tablet, Take 1 tablet (4 mg total) by mouth every 8 (eight) hours as needed for nausea or vomiting., Disp: 20 tablet, Rfl: 0   oxyCODONE-acetaminophen (PERCOCET) 5-325 MG tablet, Take 1 tablet by mouth every 4 (four) hours as needed for severe pain., Disp: 14 tablet, Rfl: 0   tadalafil (CIALIS) 20 MG tablet, Take 0.5-1 tablets (10-20 mg total) by mouth every other day as needed for erectile dysfunction., Disp: 10 tablet, Rfl: 3   tamsulosin (FLOMAX) 0.4 MG CAPS capsule, Take 1 capsule (0.4 mg total) by mouth daily after supper., Disp: 10 capsule, Rfl: 0  Allergies  Allergen Reactions    Augmentin [Amoxicillin-Pot Clavulanate] Nausea Only     Review of Systems  All other systems reviewed and are negative.   Objective  Vitals:   11/09/21 1259  BP: 122/78  Pulse: 81  Resp: 16  Temp: 98 F (36.7 C)  SpO2: 95%  Weight: 227 lb 4.8 oz (103.1 kg)  Height: 6\' 4"  (1.93 m)    Body mass index is 27.67 kg/m.  Physical Exam Constitutional:      Appearance: Normal appearance.  HENT:     Head: Normocephalic and atraumatic.     Mouth/Throat:     Mouth: Mucous membranes are moist.     Pharynx: Oropharynx is clear.  Eyes:     Conjunctiva/sclera: Conjunctivae normal.     Pupils: Pupils are equal, round, and reactive to light.  Cardiovascular:     Rate and Rhythm: Normal rate and regular rhythm.  Pulmonary:     Effort: Pulmonary effort is normal.     Breath sounds: Normal breath sounds.  Abdominal:     General: There is no distension.     Palpations: Abdomen is soft.     Tenderness: There is no abdominal tenderness.  Musculoskeletal:     Right lower leg: No edema.     Left lower leg: No edema.  Lymphadenopathy:     Cervical: No cervical adenopathy.  Skin:    General: Skin is warm and dry.  Neurological:     General: No focal deficit present.     Mental Status: He is alert. Mental status is at baseline.  Psychiatric:        Mood and Affect: Mood normal.        Behavior: Behavior normal.     No results found for this or any previous visit (from the past 2160 hour(s)).   Fall Risk:    11/09/2021    1:00 PM 10/01/2020    3:21 PM 09/02/2019    2:37 PM 03/19/2019    9:34 AM 02/01/2019    8:40 AM  Fall Risk   Falls in the past year? 0 0 0 0 0  Number falls in past yr: 0 0 0 0 0  Injury with Fall? 0 0 0 0 0  Follow up    Falls evaluation completed Falls evaluation completed     Functional Status Survey: Is the patient deaf or have difficulty hearing?: No Does the patient have difficulty seeing, even when wearing glasses/contacts?: No Does the  patient have difficulty concentrating, remembering, or making decisions?: No Does the patient have difficulty walking or climbing stairs?: No Does the patient have difficulty dressing or bathing?: No Does the patient have  difficulty doing errands alone such as visiting a doctor's office or shopping?: No    Assessment & Plan  1. Annual physical exam/ Hyperlipidemia, unspecified hyperlipidemia type/Prediabetes/Screening for prostate cancer: Screening labs due today.   - CBC w/Diff/Platelet - COMPLETE METABOLIC PANEL WITH GFR - Lipid Profile - PSA - HgB A1c  2. Need for Tdap vaccination: Tdap administered today.   - Tdap vaccine greater than or equal to 7yo IM  3. Screening for colon cancer: Cologuard ordered today.  - Cologuard   -Prostate cancer screening and PSA options (with potential risks and benefits of testing vs not testing) were discussed along with recent recs/guidelines. -USPSTF grade A and B recommendations reviewed with patient; age-appropriate recommendations, preventive care, screening tests, etc discussed and encouraged; healthy living encouraged; see AVS for patient education given to patient -Discussed importance of 150 minutes of physical activity weekly, eat two servings of fish weekly, eat one serving of tree nuts ( cashews, pistachios, pecans, almonds.[name]) every other day, eat 6 servings of fruit/vegetables daily and drink plenty of water and avoid sweet beverages.  -Reviewed Health Maintenance: yes

## 2021-11-09 NOTE — Patient Instructions (Addendum)
It was great seeing you today!  Plan discussed at today's visit: -Blood work ordered today, results will be uploaded to MyChart.  -Tdap vaccine today, consider Shingles vaccine at your pharmacy, information below  Follow up in: 1 year  Take care and let us know if you have any questions or concerns prior to your next visit.  Dr. Caralee Ates  Health Maintenance, Male Adopting a healthy lifestyle and getting preventive care are important in promoting health and wellness. Ask your health care provider about: The right schedule for you to have regular tests and exams. Things you can do on your own to prevent diseases and keep yourself healthy. What should I know about diet, weight, and exercise? Eat a healthy diet  Eat a diet that includes plenty of vegetables, fruits, low-fat dairy products, and lean protein. Do not eat a lot of foods that are high in solid fats, added sugars, or sodium. Maintain a healthy weight Body mass index (BMI) is a measurement that can be used to identify possible weight problems. It estimates body fat based on height and weight. Your health care provider can help determine your BMI and help you achieve or maintain a healthy weight. Get regular exercise Get regular exercise. This is one of the most important things you can do for your health. Most adults should: Exercise for at least 150 minutes each week. The exercise should increase your heart rate and make you sweat (moderate-intensity exercise). Do strengthening exercises at least twice a week. This is in addition to the moderate-intensity exercise. Spend less time sitting. Even light physical activity can be beneficial. Watch cholesterol and blood lipids Have your blood tested for lipids and cholesterol at 60 years of age, then have this test every 5 years. You may need to have your cholesterol levels checked more often if: Your lipid or cholesterol levels are high. You are older than 59 years of age. You are  at high risk for heart disease. What should I know about cancer screening? Many types of cancers can be detected early and may often be prevented. Depending on your health history and family history, you may need to have cancer screening at various ages. This may include screening for: Colorectal cancer. Prostate cancer. Skin cancer. Lung cancer. What should I know about heart disease, diabetes, and high blood pressure? Blood pressure and heart disease High blood pressure causes heart disease and increases the risk of stroke. This is more likely to develop in people who have high blood pressure readings or are overweight. Talk with your health care provider about your target blood pressure readings. Have your blood pressure checked: Every 3-5 years if you are 42-24 years of age. Every year if you are 95 years old or older. If you are between the ages of 33 and 4 and are a current or former smoker, ask your health care provider if you should have a one-time screening for abdominal aortic aneurysm (AAA). Diabetes Have regular diabetes screenings. This checks your fasting blood sugar level. Have the screening done: Once every three years after age 26 if you are at a normal weight and have a low risk for diabetes. More often and at a younger age if you are overweight or have a high risk for diabetes. What should I know about preventing infection? Hepatitis B If you have a higher risk for hepatitis B, you should be screened for this virus. Talk with your health care provider to find out if you are at risk for  hepatitis B infection. Hepatitis C Blood testing is recommended for: Everyone born from [birth year] through 1963. Anyone with known risk factors for hepatitis C. Sexually transmitted infections (STIs) You should be screened each year for STIs, including gonorrhea and chlamydia, if: You are sexually active and are younger than 59 years of age. You are older than 59 years of age and your health  care provider tells you that you are at risk for this type of infection. Your sexual activity has changed since you were last screened, and you are at increased risk for chlamydia or gonorrhea. Ask your health care provider if you are at risk. Ask your health care provider about whether you are at high risk for HIV. Your health care provider may recommend a prescription medicine to help prevent HIV infection. If you choose to take medicine to prevent HIV, you should first get tested for HIV. You should then be tested every 3 months for as long as you are taking the medicine. Follow these instructions at home: Alcohol use Do not drink alcohol if your health care provider tells you not to drink. If you drink alcohol: Limit how much you have to 0-2 drinks a day. Know how much alcohol is in your drink. In the [place] one drink equals one 12 oz bottle of beer (355 mL), one 5 oz glass of wine (148 mL), or one 1 oz glass of hard liquor (44 mL). Lifestyle Do not use any products that contain nicotine or tobacco. These products include cigarettes, chewing tobacco, and vaping devices, such as e-cigarettes. If you need help quitting, ask your health care provider. Do not use street drugs. Do not share needles. Ask your health care provider for help if you need support or information about quitting drugs. General instructions Schedule regular health, dental, and eye exams. Stay current with your vaccines. Tell your health care provider if: You often feel depressed. You have ever been abused or do not feel safe at home. Summary Adopting a healthy lifestyle and getting preventive care are important in promoting health and wellness. Follow your health care provider's instructions about healthy diet, exercising, and getting tested or screened for diseases. Follow your health care provider's instructions on monitoring your cholesterol and blood pressure. This information is not intended to replace advice given  to you by your health care provider. Make sure you discuss any questions you have with your health care provider. Document Revised: 08/03/2020 Document Reviewed: 08/03/2020 Elsevier Patient Education  2023 [employer].  Recombinant Zoster (Shingles) Vaccine: What You Need to Know 1. Why get vaccinated? Recombinant zoster (shingles) vaccine can prevent shingles. Shingles (also called herpes zoster, or just zoster) is a painful skin rash, usually with blisters. In addition to the rash, shingles can cause fever, headache, chills, or upset stomach. Rarely, shingles can lead to complications such as pneumonia, hearing problems, blindness, brain inflammation (encephalitis), or death. The risk of shingles increases with age. The most common complication of shingles is long-term nerve pain called postherpetic neuralgia (PHN). PHN occurs in the areas where the shingles rash was and can last for months or years after the rash goes away. The pain from PHN can be severe and debilitating. The risk of PHN increases with age. An older adult with shingles is more likely to develop PHN and have longer lasting and more severe pain than a younger person. People with weakened immune systems also have a higher risk of getting shingles and complications from the disease. Shingles is caused by  varicella-zoster virus, the same virus that causes chickenpox. After you have chickenpox, the virus stays in your body and can cause shingles later in life. Shingles cannot be passed from one person to another, but the virus that causes shingles can spread and cause chickenpox in someone who has never had chickenpox or has never received chickenpox vaccine. 2. Recombinant shingles vaccine Recombinant shingles vaccine provides strong protection against shingles. By preventing shingles, recombinant shingles vaccine also protects against PHN and other complications. Recombinant shingles vaccine is recommended for: Adults 50 years and  older Adults 19 years and older who have a weakened immune system because of disease or treatments Shingles vaccine is given as a two-dose series. For most people, the second dose should be given 2 to 6 months after the first dose. Some people who have or will have a weakened immune system can get the second dose 1 to 2 months after the first dose. Ask your health care provider for guidance. People who have had shingles in the past and people who have received varicella (chickenpox) vaccine are recommended to get recombinant shingles vaccine. The vaccine is also recommended for people who have already gotten another type of shingles vaccine, the live shingles vaccine. There is no live virus in recombinant shingles vaccine. Shingles vaccine may be given at the same time as other vaccines. 3. Talk with your health care provider Tell your vaccination provider if the person getting the vaccine: Has had an allergic reaction after a previous dose of recombinant shingles vaccine, or has any severe, life-threatening allergies Is currently experiencing an episode of shingles Is pregnant In some cases, your health care provider may decide to postpone shingles vaccination until a future visit. People with minor illnesses, such as a cold, may be vaccinated. People who are moderately or severely ill should usually wait until they recover before getting recombinant shingles vaccine. Your health care provider can give you more information. 4. Risks of a vaccine reaction A sore arm with mild or moderate pain is very common after recombinant shingles vaccine. Redness and swelling can also happen at the site of the injection. Tiredness, muscle pain, headache, shivering, fever, stomach pain, and nausea are common after recombinant shingles vaccine. These side effects may temporarily prevent a vaccinated person from doing regular activities. Symptoms usually go away on their own in 2 to 3 days. You should still get the  second dose of recombinant shingles vaccine even if you had one of these reactions after the first dose. Guillain-Barr syndrome (GBS), a serious nervous system disorder, has been reported very rarely after recombinant zoster vaccine. People sometimes faint after medical procedures, including vaccination. Tell your provider if you feel dizzy or have vision changes or ringing in the ears. As with any medicine, there is a very remote chance of a vaccine causing a severe allergic reaction, other serious injury, or death. 5. What if there is a serious problem? An allergic reaction could occur after the vaccinated person leaves the clinic. If you see signs of a severe allergic reaction (hives, swelling of the face and throat, difficulty breathing, a fast heartbeat, dizziness, or weakness), call 9-1-1 and get the person to the nearest hospital. For other signs that concern you, call your health care provider. Adverse reactions should be reported to the Vaccine Adverse Event Reporting System (VAERS). Your health care provider will usually file this report, or you can do it yourself. Visit the VAERS website at www.vaers.LAgents.no or call 660-445-1512. VAERS is only for reporting  reactions, and VAERS staff members do not give medical advice. 6. How can I learn more? Ask your health care provider. Call your local or state health department. Visit the website of the Food and Drug Administration (FDA) for vaccine package inserts and additional information at GoldCloset.com.ee. Contact the Centers for Disease Control and Prevention (CDC): Call 934 456 0751 (1-800-CDC-INFO) or Visit CDC's website at PicCapture.uy. Source: CDC Vaccine Information Statement Recombinant Zoster Vaccine (05/01/2020) This same material is available at FootballExhibition.com.br for no charge. This information is not intended to replace advice given to you by your health care provider. Make sure you discuss any  questions you have with your health care provider. Document Revised: 02/10/2021 Document Reviewed: 05/15/2020 Elsevier Patient Education  2023 [employer].

## 2021-11-10 LAB — CBC WITH DIFFERENTIAL/PLATELET
Absolute Monocytes: 459 cells/uL (ref 200–950)
Basophils Absolute: 50 cells/uL (ref 0–200)
Basophils Relative: 0.8 %
Eosinophils Absolute: 167 cells/uL (ref 15–500)
Eosinophils Relative: 2.7 %
HCT: 42.6 % (ref 38.5–50.0)
Hemoglobin: 14.8 g/dL (ref 13.2–17.1)
Lymphs Abs: 2207 cells/uL (ref 850–3900)
MCH: 30.6 pg (ref 27.0–33.0)
MCHC: 34.7 g/dL (ref 32.0–36.0)
MCV: 88.2 fL (ref 80.0–100.0)
MPV: 10.2 fL (ref 7.5–12.5)
Monocytes Relative: 7.4 %
Neutro Abs: 3317 cells/uL (ref 1500–7800)
Neutrophils Relative %: 53.5 %
Platelets: 167 10*3/uL (ref 140–400)
RBC: 4.83 10*6/uL (ref 4.20–5.80)
RDW: 12.9 % (ref 11.0–15.0)
Total Lymphocyte: 35.6 %
WBC: 6.2 10*3/uL (ref 3.8–10.8)

## 2021-11-10 LAB — COMPLETE METABOLIC PANEL WITH GFR
AG Ratio: 1.4 (calc) (ref 1.0–2.5)
ALT: 25 U/L (ref 9–46)
AST: 19 U/L (ref 10–35)
Albumin: 4.4 g/dL (ref 3.6–5.1)
Alkaline phosphatase (APISO): 49 U/L (ref 35–144)
BUN: 13 mg/dL (ref 7–25)
CO2: 25 mmol/L (ref 20–32)
Calcium: 9.4 mg/dL (ref 8.6–10.3)
Chloride: 104 mmol/L (ref 98–110)
Creat: 1.05 mg/dL (ref 0.70–1.30)
Globulin: 3.1 g/dL (calc) (ref 1.9–3.7)
Glucose, Bld: 95 mg/dL (ref 65–99)
Potassium: 4.2 mmol/L (ref 3.5–5.3)
Sodium: 141 mmol/L (ref 135–146)
Total Bilirubin: 0.7 mg/dL (ref 0.2–1.2)
Total Protein: 7.5 g/dL (ref 6.1–8.1)
eGFR: 82 mL/min/{1.73_m2} (ref >=60)

## 2021-11-10 LAB — HEMOGLOBIN A1C
Hgb A1c MFr Bld: 5.6 % of total Hgb (ref <5.7)
Mean Plasma Glucose: 114 mg/dL
eAG (mmol/L): 6.3 mmol/L

## 2021-11-10 LAB — LIPID PANEL
Cholesterol: 165 mg/dL (ref <200)
HDL: 47 mg/dL (ref >=40)
LDL Cholesterol (Calc): 95 mg/dL (calc)
Non-HDL Cholesterol (Calc): 118 mg/dL (calc) (ref <130)
Total CHOL/HDL Ratio: 3.5 (calc) (ref <5.0)
Triglycerides: 133 mg/dL (ref <150)

## 2021-11-10 LAB — PSA: PSA: 0.63 ng/mL (ref <=4.00)

## 2021-11-24 ENCOUNTER — Other Ambulatory Visit: Payer: Self-pay | Admitting: Family Medicine

## 2021-11-24 DIAGNOSIS — J309 Allergic rhinitis, unspecified: Secondary | ICD-10-CM

## 2021-11-25 NOTE — Telephone Encounter (Signed)
Requested Prescriptions  Pending Prescriptions Disp Refills  . azelastine (ASTELIN) 0.1 % nasal spray [Pharmacy Med Name: AZELASTINE 0.1% (137 MCG) SPRY] 30 mL 0    Sig: Place 2 sprays into both nostrils 2 (two) times daily. Use in each nostril as directed     Ear, Nose, and Throat: Nasal Preparations - Antiallergy Passed - 11/24/2021  5:39 PM      Passed - Valid encounter within last 12 months    Recent Outpatient Visits          2 weeks ago Annual physical exam   [facility] Margarita Mail, DO   1 year ago Annual physical exam   [facility] Danelle Berry, PA-C   2 years ago Mild episode of recurrent major depressive disorder [facility])   [facility] Danelle Berry, PA-C   2 years ago Annual physical exam   [facility] Doren Custard, FNP   2 years ago Obstructive sleep apnea   [facility] Doren Custard, [place]      Future Appointments            In 11 months Danelle Berry, PA-C [facility]           . ALLERGY RELIEF 180 MG tablet [Pharmacy Med Name: ALLERGY RELIEF 180 MG TABLET] 90 tablet 0    Sig: Take 1 tablet (180 mg total) by mouth daily as needed for allergies or rhinitis.     Ear, Nose, and Throat:  Antihistamines Passed - 11/24/2021  5:39 PM      Passed - Valid encounter within last 12 months    Recent Outpatient Visits          2 weeks ago Annual physical exam   [facility] Margarita Mail, DO   1 year ago Annual physical exam   [facility] Danelle Berry, PA-C   2 years ago Mild episode of recurrent major depressive disorder [facility])   [facility] Danelle Berry, PA-C   2 years ago Annual physical exam   [facility] Doren Custard, FNP   2 years ago Obstructive sleep apnea   [facility] Doren Custard, [place]      Future Appointments            In  11 months Danelle Berry, PA-C [facility]

## 2022-01-27 ENCOUNTER — Other Ambulatory Visit: Payer: Self-pay | Admitting: Family Medicine

## 2022-01-27 DIAGNOSIS — J309 Allergic rhinitis, unspecified: Secondary | ICD-10-CM

## 2022-03-18 ENCOUNTER — Other Ambulatory Visit: Payer: Self-pay | Admitting: Family Medicine

## 2022-07-12 ENCOUNTER — Other Ambulatory Visit: Payer: Self-pay | Admitting: Family Medicine

## 2022-11-11 ENCOUNTER — Encounter: Payer: No Typology Code available for payment source | Admitting: Family Medicine

## 2022-11-15 ENCOUNTER — Ambulatory Visit: Payer: 59 | Admitting: Nurse Practitioner

## 2022-11-16 ENCOUNTER — Ambulatory Visit (INDEPENDENT_AMBULATORY_CARE_PROVIDER_SITE_OTHER): Payer: Self-pay | Admitting: Nurse Practitioner

## 2022-11-16 ENCOUNTER — Encounter: Payer: Self-pay | Admitting: Nurse Practitioner

## 2022-11-16 VITALS — BP 116/80 | HR 74 | Temp 97.7°F | Resp 16 | Ht 76.0 in | Wt 238.6 lb

## 2022-11-16 DIAGNOSIS — Z1211 Encounter for screening for malignant neoplasm of colon: Secondary | ICD-10-CM

## 2022-11-16 DIAGNOSIS — Z130 Encounter for screening for diseases of the blood and blood-forming organs and certain disorders involving the immune mechanism: Secondary | ICD-10-CM

## 2022-11-16 DIAGNOSIS — Z0000 Encounter for general adult medical examination without abnormal findings: Secondary | ICD-10-CM

## 2022-11-16 DIAGNOSIS — Z125 Encounter for screening for malignant neoplasm of prostate: Secondary | ICD-10-CM

## 2022-11-16 DIAGNOSIS — R7303 Prediabetes: Secondary | ICD-10-CM

## 2022-11-16 DIAGNOSIS — E785 Hyperlipidemia, unspecified: Secondary | ICD-10-CM

## 2022-11-16 NOTE — Progress Notes (Signed)
Name: Richard [name].   MRN: [id number]    DOB: October 20, 1960   Date:11/16/2022       Progress Note  Subjective  Chief Complaint  Chief Complaint  Patient presents with   Annual Exam    HPI  Patient presents for annual CPE.  IPSS Questionnaire (AUA-7): Over the past month.   1)  How often have you had a sensation of not emptying your bladder completely after you finish urinating?  0 - Not at all  2)  How often have you had to urinate again less than two hours after you finished urinating? 3 - About half the time  3)  How often have you found you stopped and started again several times when you urinated?  0 - Not at all  4) How difficult have you found it to postpone urination?  2 - Less than half the time  5) How often have you had a weak urinary stream?  0 - Not at all  6) How often have you had to push or strain to begin urination?  0 - Not at all  7) How many times did you most typically get up to urinate from the time you went to bed until the time you got up in the morning?  3 - 3 times  Total score:  0-7 mildly symptomatic   8-19 moderately symptomatic   20-35 severely symptomatic     Diet: tries to eat well balanced Exercise: lifts weights, golf,  walks about a day Sleep:  at least 6 hours Last dental exam:unknown Last eye exam: 06/2022  Depression: phq 9 is negative    11/16/2022    8:32 AM 11/09/2021    1:04 PM 10/01/2020    3:21 PM 09/02/2019    2:37 PM 03/19/2019    9:34 AM  Depression screen PHQ 2/9  Decreased Interest 0 0 0 0 1  Down, Depressed, Hopeless 0 0 0 0 1  PHQ - 2 Score 0 0 0 0 2  Altered sleeping 0 0 0 1 1  Tired, decreased energy 0 0 0 1 1  Change in appetite 0 0 0 0 0  Feeling bad or failure about yourself  0 0 0 0 0  Trouble concentrating 0 0 0 0 0  Moving slowly or fidgety/restless 0 0 0 0 0  Suicidal thoughts 0 0 0 0 0  PHQ-9 Score 0 0 0 2 4  Difficult doing work/chores Not difficult at all Not difficult at all Not difficult at all Not  difficult at all     Hypertension:  BP Readings from Last 3 Encounters:  11/16/22 116/80  11/09/21 122/78  12/03/20 120/79    Obesity: Wt Readings from Last 3 Encounters:  11/16/22 238 lb 9.6 oz (108.2 kg)  11/09/21 227 lb 4.8 oz (103.1 kg)  12/03/20 229 lb (103.9 kg)   BMI Readings from Last 3 Encounters:  11/16/22 29.04 kg/m  11/09/21 27.67 kg/m  12/03/20 27.87 kg/m     Lipids:  Lab Results  Component Value Date   CHOL 165 11/09/2021   CHOL 140 10/01/2020   CHOL 169 09/02/2019   Lab Results  Component Value Date   HDL 47 11/09/2021   HDL 40 10/01/2020   HDL 36 (L) 09/02/2019   Lab Results  Component Value Date   LDLCALC 95 11/09/2021   LDLCALC 77 10/01/2020   LDLCALC 105 (H) 09/02/2019   Lab Results  Component Value Date   TRIG 133  11/09/2021   TRIG 129 10/01/2020   TRIG 160 (H) 09/02/2019   Lab Results  Component Value Date   CHOLHDL 3.5 11/09/2021   CHOLHDL 3.5 10/01/2020   CHOLHDL 4.7 09/02/2019   No results found for: "LDLDIRECT" Glucose:  Glucose, Bld  Date Value Ref Range Status  11/09/2021 95 65 - 99 mg/dL Final    Comment:    .            Fasting reference interval .   12/03/2020 138 (H) 70 - 99 mg/dL Final    Comment:    Glucose reference range applies only to samples taken after fasting for at least 8 hours.  10/01/2020 106 (H) 65 - 99 mg/dL Final    Comment:    .            Fasting reference interval . For someone without known diabetes, a glucose value between 100 and 125 mg/dL is consistent with prediabetes and should be confirmed with a follow-up test. .    Glucose-Capillary  Date Value Ref Range Status  10/03/2015 121 (H) 65 - 99 mg/dL Final    Flowsheet Row Office Visit from 11/16/2022 in [facility]  AUDIT-C Score 3       Married STD testing and prevention (HIV/chl/gon/syphilis): completed Hep C: completed Sexually active: yes Skin cancer: Discussed monitoring for atypical  lesions Colorectal cancer: due Prostate cancer:  Lab Results  Component Value Date   PSA 0.63 11/09/2021   PSA 0.69 10/01/2020   PSA 0.7 03/19/2019     Lung cancer:   Low Dose CT Chest recommended if Age 8-80 years, 30 pack-year currently smoking OR have quit w/in 15years. Patient does not qualify.   AAA:  The USPSTF recommends one-time screening with ultrasonography in men ages 23 to 62 years who have ever smoked ECG:  10/04/2015  Vaccines:  HPV: up to at age 39 , ask insurance if age between 13-45  Shingrix: 6-64 yo and ask insurance if covered when patient above 62 yo Pneumonia:  educated and discussed with patient. Flu:  educated and discussed with patient.  Advanced Care Planning: A voluntary discussion about advance care planning including the explanation and discussion of advance directives.  Discussed health care proxy and Living will, and the patient was able to identify a health care proxy as wife, Marylene Land.  Patient does have a living will at present time. If patient does have living will, I have requested they bring this to the clinic to be scanned in to their chart.  Patient Active Problem List   Diagnosis Date Noted   Chronic pain of left knee 11/19/2017   Pseudogout 11/19/2017   Pain in joint of left hip 11/19/2017   Pain, joint, multiple sites 11/19/2017   DDD (degenerative disc disease), lumbar 11/19/2017   Rheumatoid factor positive 11/14/2017   Generalized osteoarthritis 11/14/2017   Chondrocalcinosis 11/14/2017   PTSD (post-traumatic stress disorder) 10/27/2017   Chronic midline low back pain with left-sided sciatica 10/27/2017   Obstructive sleep apnea 11/02/2016   High triglycerides 09/10/2015   Fatty liver 09/10/2015   Overweight (BMI 25.0-29.9) 11/06/2014   Allergic rhinitis 11/06/2014   Low HDL (under 40)    Chronic anxiety 10/21/2014   Depression 07/15/2014   Agoraphobia without history of panic disorder    Elevated serum glutamic pyruvic  transaminase (SGPT) level     Past Surgical History:  Procedure Laterality Date   NASAL SINUS SURGERY  2008    Family  History  Problem Relation Age of Onset   Heart disease Father        heart attack age 43   Heart attack Father    Hyperlipidemia Father    Sleep apnea Father    Colon polyps Father    Cancer Brother        hodgins, liver cancer   Cancer Brother     Social History   Socioeconomic History   Marital status: Married    Spouse name: Not on file   Number of children: 1   Years of education: Not on file   Highest education level: Not on file  Occupational History   Not on file  Tobacco Use   Smoking status: Former    Current packs/day: 0.00    Average packs/day: 1 pack/day for 18.0 years (18.0 ttl pk-yrs)    Types: Cigarettes    Start date: 03/28/1972    Quit date: 03/28/1990    Years since quitting: 32.6   Smokeless tobacco: Current    Types: Snuff  Vaping Use   Vaping status: Never Used  Substance and Sexual Activity   Alcohol use: Yes    Alcohol/week: 3.0 standard drinks of alcohol    Types: 3 Cans of beer per week    Comment: occasionally   Drug use: No   Sexual activity: Yes  Other Topics Concern   Not on file  Social History Narrative   Not on file   Social Determinants of Health   Financial Resource Strain: Low Risk  (11/16/2022)   Overall Financial Resource Strain (CARDIA)    Difficulty of Paying Living Expenses: Not hard at all  Food Insecurity: No Food Insecurity (11/16/2022)   Hunger Vital Sign    Worried About Running Out of Food in the Last Year: Never true    Ran Out of Food in the Last Year: Never true  Transportation Needs: No Transportation Needs (11/16/2022)   PRAPARE - Administrator, Civil Service (Medical): No    Lack of Transportation (Non-Medical): No  Physical Activity: Sufficiently Active (11/16/2022)   Exercise Vital Sign    Days of Exercise per Week: 6 days    Minutes of Exercise per Session: 150+ min   Stress: No Stress Concern Present (11/16/2022)   [employer] of Occupational Health - Occupational Stress Questionnaire    Feeling of Stress : Not at all  Social Connections: Moderately Integrated (11/16/2022)   Social Connection and Isolation Panel [NHANES]    Frequency of Communication with Friends and Family: More than three times a week    Frequency of Social Gatherings with Friends and Family: More than three times a week    Attends Religious Services: More than 4 times per year    Active Member of [employer] or Organizations: No    Attends Banker Meetings: Never    Marital Status: Married  Catering manager Violence: Not At Risk (11/16/2022)   Humiliation, Afraid, Rape, and Kick questionnaire    Fear of Current or Ex-Partner: No    Emotionally Abused: No    Physically Abused: No    Sexually Abused: No     Current Outpatient Medications:    azelastine (ASTELIN) 0.1 % nasal spray, Place 2 sprays into both nostrils 2 (two) times daily. Use in each nostril as directed, Disp: 30 mL, Rfl: 5   cholecalciferol (VITAMIN D) 1000 UNITS tablet, Take 1,000 Units by mouth daily., Disp: , Rfl:    Multiple Vitamin (MULTIVITAMIN) tablet, Take  1 tablet by mouth daily., Disp: , Rfl:    ALLERGY RELIEF 180 MG tablet, Take 1 tablet (180 mg total) by mouth daily as needed for allergies or rhinitis. (Patient not taking: Reported on 11/16/2022), Disp: 30 tablet, Rfl: 0  Allergies  Allergen Reactions   Augmentin [Amoxicillin-Pot Clavulanate] Nausea Only     ROS  Constitutional: Negative for fever or weight change.  Respiratory: Negative for cough and shortness of breath.   Cardiovascular: Negative for chest pain or palpitations.  Gastrointestinal: Negative for abdominal pain, no bowel changes.  Musculoskeletal: Negative for gait problem or joint swelling.  Skin: Negative for rash.  Neurological: Negative for dizziness or headache.  No other specific complaints in a complete review of  systems (except as listed in HPI above).    Objective  Vitals:   11/16/22 0843  BP: 116/80  Pulse: 74  Resp: 16  Temp: 97.7 F (36.5 C)  TempSrc: Oral  SpO2: 98%  Weight: 238 lb 9.6 oz (108.2 kg)  Height: 6\' 4"  (1.93 m)    Body mass index is 29.04 kg/m.  Physical Exam Constitutional: Patient appears well-developed and well-nourished. No distress.  HENT: Head: Normocephalic and atraumatic. Ears: B TMs ok, no erythema or effusion; Nose: Nose normal. Mouth/Throat: Oropharynx is clear and moist. No oropharyngeal exudate.  Eyes: Conjunctivae and EOM are normal. Pupils are equal, round, and reactive to light. No scleral icterus.  Neck: Normal range of motion. Neck supple. No JVD present. No thyromegaly present.  Cardiovascular: Normal rate, regular rhythm and normal heart sounds.  No murmur heard. No BLE edema. Pulmonary/Chest: Effort normal and breath sounds normal. No respiratory distress. Abdominal: Soft. Bowel sounds are normal, no distension. There is no tenderness. no masses Musculoskeletal: Normal range of motion, no joint effusions. No gross deformities Neurological: he is alert and oriented to person, place, and time. No cranial nerve deficit. Coordination, balance, strength, speech and gait are normal.  Skin: Skin is warm and dry. No rash noted. No erythema.  Psychiatric: Patient has a normal mood and affect. behavior is normal. Judgment and thought content normal.   No results found for this or any previous visit (from the past 2160 hour(s)).   Fall Risk:    11/16/2022    8:31 AM 11/09/2021    1:00 PM 10/01/2020    3:21 PM 09/02/2019    2:37 PM 03/19/2019    9:34 AM  Fall Risk   Falls in the past year? 0 0 0 0 0  Number falls in past yr: 0 0 0 0 0  Injury with Fall? 0 0 0 0 0  Risk for fall due to : No Fall Risks      Follow up Falls prevention discussed;Education provided;Falls evaluation completed    Falls evaluation completed     Functional Status Survey: Is  the patient deaf or have difficulty hearing?: No Does the patient have difficulty seeing, even when wearing glasses/contacts?: No Does the patient have difficulty concentrating, remembering, or making decisions?: No Does the patient have difficulty walking or climbing stairs?: No Does the patient have difficulty dressing or bathing?: No Does the patient have difficulty doing errands alone such as visiting a doctor's office or shopping?: No    Assessment & Plan  1. Annual physical exam  - COMPLETE METABOLIC PANEL WITH GFR - CBC with Differential/Platelet - Lipid panel - PSA - Hemoglobin A1c  2. Screening for colon cancer  - Cologuard  3. Screening for prostate cancer  - PSA  4. Hyperlipidemia, unspecified hyperlipidemia type  - Lipid panel  5. Prediabetes  - COMPLETE METABOLIC PANEL WITH GFR - Hemoglobin A1c  6. Screening for deficiency anemia  - CBC with Differential/Platelet    -Prostate cancer screening and PSA options (with potential risks and benefits of testing vs not testing) were discussed along with recent recs/guidelines. -USPSTF grade A and B recommendations reviewed with patient; age-appropriate recommendations, preventive care, screening tests, etc discussed and encouraged; healthy living encouraged; see AVS for patient education given to patient -Discussed importance of 150 minutes of physical activity weekly, eat two servings of fish weekly, eat one serving of tree nuts ( cashews, pistachios, pecans, almonds.[name]) every other day, eat 6 servings of fruit/vegetables daily and drink plenty of water and avoid sweet beverages.  -Reviewed Health Maintenance: yes

## 2022-11-17 LAB — COMPLETE METABOLIC PANEL WITH GFR
AG Ratio: 1.5 (calc) (ref 1.0–2.5)
ALT: 35 U/L (ref 9–46)
AST: 29 U/L (ref 10–35)
Albumin: 4.7 g/dL (ref 3.6–5.1)
Alkaline phosphatase (APISO): 65 U/L (ref 35–144)
BUN: 14 mg/dL (ref 7–25)
CO2: 29 mmol/L (ref 20–32)
Calcium: 10.1 mg/dL (ref 8.6–10.3)
Chloride: 100 mmol/L (ref 98–110)
Creat: 1.13 mg/dL (ref 0.70–1.35)
Globulin: 3.1 g/dL (ref 1.9–3.7)
Glucose, Bld: 102 mg/dL — ABNORMAL HIGH (ref 65–99)
Potassium: 4.6 mmol/L (ref 3.5–5.3)
Sodium: 139 mmol/L (ref 135–146)
Total Bilirubin: 0.4 mg/dL (ref 0.2–1.2)
Total Protein: 7.8 g/dL (ref 6.1–8.1)
eGFR: 74 mL/min/{1.73_m2} (ref >=60)

## 2022-11-17 LAB — CBC WITH DIFFERENTIAL/PLATELET
Absolute Monocytes: 653 {cells}/uL (ref 200–950)
Basophils Absolute: 38 {cells}/uL (ref 0–200)
Basophils Relative: 0.5 %
Eosinophils Absolute: 120 {cells}/uL (ref 15–500)
Eosinophils Relative: 1.6 %
HCT: 42.1 % (ref 38.5–50.0)
Hemoglobin: 14.2 g/dL (ref 13.2–17.1)
Lymphs Abs: 2145 {cells}/uL (ref 850–3900)
MCH: 30.3 pg (ref 27.0–33.0)
MCHC: 33.7 g/dL (ref 32.0–36.0)
MCV: 90 fL (ref 80.0–100.0)
MPV: 10.1 fL (ref 7.5–12.5)
Monocytes Relative: 8.7 %
Neutro Abs: 4545 {cells}/uL (ref 1500–7800)
Neutrophils Relative %: 60.6 %
Platelets: 200 10*3/uL (ref 140–400)
RBC: 4.68 10*6/uL (ref 4.20–5.80)
RDW: 12.6 % (ref 11.0–15.0)
Total Lymphocyte: 28.6 %
WBC: 7.5 10*3/uL (ref 3.8–10.8)

## 2022-11-17 LAB — LIPID PANEL
Cholesterol: 166 mg/dL (ref <200)
HDL: 48 mg/dL (ref >=40)
LDL Cholesterol (Calc): 92 mg/dL
Non-HDL Cholesterol (Calc): 118 mg/dL (ref <130)
Total CHOL/HDL Ratio: 3.5 (calc) (ref <5.0)
Triglycerides: 159 mg/dL — ABNORMAL HIGH (ref <150)

## 2022-11-17 LAB — HEMOGLOBIN A1C
Hgb A1c MFr Bld: 5.9 %{Hb} — ABNORMAL HIGH (ref <5.7)
Mean Plasma Glucose: 123 mg/dL
eAG (mmol/L): 6.8 mmol/L

## 2022-11-17 LAB — PSA: PSA: 0.78 ng/mL (ref <=4.00)

## 2022-12-09 ENCOUNTER — Other Ambulatory Visit: Payer: Self-pay | Admitting: Family Medicine

## 2022-12-09 DIAGNOSIS — J309 Allergic rhinitis, unspecified: Secondary | ICD-10-CM

## 2023-01-20 ENCOUNTER — Other Ambulatory Visit: Payer: Self-pay | Admitting: Nurse Practitioner

## 2023-01-20 DIAGNOSIS — J309 Allergic rhinitis, unspecified: Secondary | ICD-10-CM

## 2023-01-20 MED ORDER — AZELASTINE HCL 0.1 % NA SOLN: 2.0000 | Freq: Two times a day (BID) | NASAL | 5 refills | Status: DC

## 2023-01-20 NOTE — Telephone Encounter (Signed)
Requested Prescriptions  Pending Prescriptions Disp Refills   azelastine (ASTELIN) 0.1 % nasal spray 30 mL 5    Sig: Place 2 sprays into both nostrils 2 (two) times daily. Use in each nostril as directed     Ear, Nose, and Throat: Nasal Preparations - Antiallergy Passed - 01/20/2023 10:29 AM      Passed - Valid encounter within last 12 months    Recent Outpatient Visits           2 months ago Annual physical exam   [facility] Berniece Salines, FNP   1 year ago Annual physical exam   [facility] Margarita Mail, DO   2 years ago Annual physical exam   [facility] Danelle Berry, PA-C   3 years ago Mild episode of recurrent major depressive disorder [facility])   [facility] Danelle Berry, PA-C   3 years ago Annual physical exam   [facility] Doren Custard, FNP       Future Appointments             In 10 months Zane Herald, Rudolpho Sevin, FNP [facility], [facility]

## 2023-01-20 NOTE — Telephone Encounter (Signed)
Medication Refill - Medication: azelastine (ASTELIN) 0.1 % nasal spray   Has the patient contacted their pharmacy? Yes.   (Agent: If no, request that the patient contact the pharmacy for the refill. If patient does not wish to contact the pharmacy document the reason why and proceed with request.) (Agent: If yes, when and what did the pharmacy advise?)  Preferred Pharmacy (with phone number or street name):  [facility] [phone] Dan Humphreys, [place] - [address]  [address] [place] [address]  Phone: [phone] Fax: [phone]   Has the patient been seen for an appointment in the last year OR does the patient have an upcoming appointment? Yes.    Agent: Please be advised that RX refills may take up to 3 business days. We ask that you follow-up with your pharmacy.

## 2023-07-16 ENCOUNTER — Other Ambulatory Visit: Payer: Self-pay | Admitting: Nurse Practitioner

## 2023-07-16 DIAGNOSIS — J309 Allergic rhinitis, unspecified: Secondary | ICD-10-CM

## 2023-07-17 NOTE — Telephone Encounter (Signed)
 Requested Prescriptions  Pending Prescriptions Disp Refills   Azelastine  HCl 137 MCG/SPRAY SOLN [Pharmacy Med Name: AZELASTINE  0.1% (137 MCG) SPRY]  5    Sig: PLACE 2 SPRAYS INTO BOTH NOSTRILS 2 (TWO) TIMES DAILY. USE IN EACH NOSTRIL AS DIRECTED     Ear, Nose, and Throat: Nasal Preparations - Antiallergy Failed - 07/17/2023  2:37 PM      Failed - Valid encounter within last 12 months    Recent Outpatient Visits   None     Future Appointments             In 4 months Abram Hoguet, Monalisa Angles, FNP [facility], [facility]

## 2023-10-14 ENCOUNTER — Other Ambulatory Visit: Payer: Self-pay | Admitting: Nurse Practitioner

## 2023-10-14 DIAGNOSIS — J309 Allergic rhinitis, unspecified: Secondary | ICD-10-CM

## 2023-10-17 NOTE — Telephone Encounter (Signed)
 Requested Prescriptions  Pending Prescriptions Disp Refills   Azelastine  HCl 137 MCG/SPRAY SOLN [Pharmacy Med Name: AZELASTINE  0.1% (137 MCG) SPRY] 30 mL 0    Sig: PLACE 2 SPRAYS INTO BOTH NOSTRILS 2 (TWO) TIMES DAILY. USE IN EACH NOSTRIL AS DIRECTED     Ear, Nose, and Throat: Nasal Preparations - Antiallergy Failed - 10/17/2023  9:07 AM      Failed - Valid encounter within last 12 months    Recent Outpatient Visits   None     Future Appointments             In 1 month Pender, Mliss FALCON, FNP [facility], [facility]

## 2023-11-15 ENCOUNTER — Other Ambulatory Visit: Payer: Self-pay | Admitting: Nurse Practitioner

## 2023-11-15 DIAGNOSIS — J309 Allergic rhinitis, unspecified: Secondary | ICD-10-CM

## 2023-11-16 NOTE — Telephone Encounter (Signed)
 Requested medication (s) are due for refill today:   Yes  Requested medication (s) are on the active medication list:   Yes  Future visit scheduled:   No.    Appt for 11/20/2023 for physical with Mliss is cancelled.   Has appt on 09/30/2024 with [facility] with Dr. Doretta Button.     LOV with Mliss 11/16/2022   Last ordered: 10/17/2023 30 ml, 0 refills  Unable to refill because OV due but looks like he is establishing with a new PCP.       Requested Prescriptions  Pending Prescriptions Disp Refills   Azelastine  HCl 137 MCG/SPRAY SOLN [Pharmacy Med Name: AZELASTINE  0.1% (137 MCG) SPRY]      Sig: PLACE 2 SPRAYS INTO BOTH NOSTRILS 2 (TWO) TIMES DAILY. USE IN EACH NOSTRIL AS DIRECTED     Ear, Nose, and Throat: Nasal Preparations - Antiallergy Failed - 11/16/2023  1:08 PM      Failed - Valid encounter within last 12 months    Recent Outpatient Visits   None

## 2023-11-20 ENCOUNTER — Encounter: Payer: Self-pay | Admitting: Nurse Practitioner
